# Patient Record
Sex: Female | Born: 1960 | Race: Black or African American | Hispanic: No | Marital: Single | Smoking: Former smoker
Health system: Southern US, Community
[De-identification: ages and names within clinical notes are randomized; demographics above are authoritative.]

## PROBLEM LIST (undated history)

## (undated) DIAGNOSIS — F419 Anxiety disorder, unspecified: Secondary | ICD-10-CM

## (undated) DIAGNOSIS — I10 Essential (primary) hypertension: Secondary | ICD-10-CM

## (undated) DIAGNOSIS — M1990 Unspecified osteoarthritis, unspecified site: Secondary | ICD-10-CM

## (undated) DIAGNOSIS — D219 Benign neoplasm of connective and other soft tissue, unspecified: Secondary | ICD-10-CM

## (undated) DIAGNOSIS — T7840XA Allergy, unspecified, initial encounter: Secondary | ICD-10-CM

## (undated) DIAGNOSIS — R011 Cardiac murmur, unspecified: Secondary | ICD-10-CM

## (undated) DIAGNOSIS — E119 Type 2 diabetes mellitus without complications: Secondary | ICD-10-CM

## (undated) DIAGNOSIS — D649 Anemia, unspecified: Secondary | ICD-10-CM

## (undated) HISTORY — DX: Allergy, unspecified, initial encounter: T78.40XA

## (undated) HISTORY — DX: Cardiac murmur, unspecified: R01.1

## (undated) HISTORY — DX: Anxiety disorder, unspecified: F41.9

## (undated) HISTORY — DX: Anemia, unspecified: D64.9

## (undated) HISTORY — DX: Unspecified osteoarthritis, unspecified site: M19.90

---

## 2018-08-21 ENCOUNTER — Emergency Department: Payer: No Typology Code available for payment source

## 2018-08-21 ENCOUNTER — Inpatient Hospital Stay
Admission: EM | Admit: 2018-08-21 | Discharge: 2018-08-24 | DRG: 329 | Disposition: A | Discharge status: Home / Self Care | Payer: No Typology Code available for payment source | Attending: Surgery | Admitting: Surgery

## 2018-08-21 ENCOUNTER — Encounter: Payer: Self-pay | Admitting: Emergency Medicine

## 2018-08-21 ENCOUNTER — Other Ambulatory Visit: Payer: Self-pay

## 2018-08-21 DIAGNOSIS — Z20828 Contact with and (suspected) exposure to other viral communicable diseases: Secondary | ICD-10-CM | POA: Diagnosis present

## 2018-08-21 DIAGNOSIS — Z9851 Tubal ligation status: Secondary | ICD-10-CM

## 2018-08-21 DIAGNOSIS — K439 Ventral hernia without obstruction or gangrene: Secondary | ICD-10-CM | POA: Diagnosis not present

## 2018-08-21 DIAGNOSIS — K55029 Acute infarction of small intestine, extent unspecified: Secondary | ICD-10-CM | POA: Diagnosis present

## 2018-08-21 DIAGNOSIS — Z885 Allergy status to narcotic agent status: Secondary | ICD-10-CM

## 2018-08-21 DIAGNOSIS — Z6841 Body Mass Index (BMI) 40.0 and over, adult: Secondary | ICD-10-CM

## 2018-08-21 DIAGNOSIS — K436 Other and unspecified ventral hernia with obstruction, without gangrene: Principal | ICD-10-CM | POA: Diagnosis present

## 2018-08-21 DIAGNOSIS — D259 Leiomyoma of uterus, unspecified: Secondary | ICD-10-CM | POA: Diagnosis present

## 2018-08-21 DIAGNOSIS — Z9889 Other specified postprocedural states: Secondary | ICD-10-CM

## 2018-08-21 DIAGNOSIS — Z888 Allergy status to other drugs, medicaments and biological substances status: Secondary | ICD-10-CM

## 2018-08-21 DIAGNOSIS — E119 Type 2 diabetes mellitus without complications: Secondary | ICD-10-CM | POA: Diagnosis present

## 2018-08-21 DIAGNOSIS — K450 Other specified abdominal hernia with obstruction, without gangrene: Secondary | ICD-10-CM

## 2018-08-21 DIAGNOSIS — R188 Other ascites: Secondary | ICD-10-CM | POA: Diagnosis present

## 2018-08-21 DIAGNOSIS — Z8719 Personal history of other diseases of the digestive system: Secondary | ICD-10-CM

## 2018-08-21 DIAGNOSIS — I10 Essential (primary) hypertension: Secondary | ICD-10-CM | POA: Diagnosis present

## 2018-08-21 HISTORY — DX: Type 2 diabetes mellitus without complications: E11.9

## 2018-08-21 HISTORY — DX: Benign neoplasm of connective and other soft tissue, unspecified: D21.9

## 2018-08-21 HISTORY — DX: Essential (primary) hypertension: I10

## 2018-08-21 LAB — COMPREHENSIVE METABOLIC PANEL
ALT: 12 U/L (ref 0–44)
AST: 16 U/L (ref 15–41)
Albumin: 4.2 g/dL (ref 3.5–5.0)
Alkaline Phosphatase: 71 U/L (ref 38–126)
Anion gap: 11 (ref 5–15)
BUN: 16 mg/dL (ref 6–20)
CO2: 28 mmol/L (ref 22–32)
Calcium: 9.4 mg/dL (ref 8.9–10.3)
Chloride: 98 mmol/L (ref 98–111)
Creatinine, Ser: 0.61 mg/dL (ref 0.44–1.00)
GFR calc Af Amer: >60 mL/min (ref >60)
GFR calc non Af Amer: >60 mL/min (ref >60)
Glucose, Bld: 174 mg/dL — ABNORMAL HIGH (ref 70–99)
Potassium: 3.1 mmol/L — ABNORMAL LOW (ref 3.5–5.1)
Sodium: 137 mmol/L (ref 135–145)
Total Bilirubin: 0.6 mg/dL (ref 0.3–1.2)
Total Protein: 8.2 g/dL — ABNORMAL HIGH (ref 6.5–8.1)

## 2018-08-21 LAB — LIPASE, BLOOD: Lipase: 20 U/L (ref 11–51)

## 2018-08-21 LAB — CBC
HCT: 40 % (ref 36.0–46.0)
Hemoglobin: 12.9 g/dL (ref 12.0–15.0)
MCH: 27 pg (ref 26.0–34.0)
MCHC: 32.3 g/dL (ref 30.0–36.0)
MCV: 83.9 fL (ref 80.0–100.0)
Platelets: 352 10*3/uL (ref 150–400)
RBC: 4.77 MIL/uL (ref 3.87–5.11)
RDW: 17.1 % — ABNORMAL HIGH (ref 11.5–15.5)
WBC: 14.2 10*3/uL — ABNORMAL HIGH (ref 4.0–10.5)
nRBC: 0 % (ref 0.0–0.2)

## 2018-08-21 MED ORDER — MORPHINE SULFATE (PF) 2 MG/ML IV SOLN
2.0000 mg | Freq: Once | INTRAVENOUS | Status: AC
  Administered 2018-08-21: 2 mg via INTRAVENOUS
  Filled 2018-08-21: qty 1

## 2018-08-21 MED ORDER — ONDANSETRON HCL 4 MG/2ML IJ SOLN
4.0000 mg | Freq: Once | INTRAMUSCULAR | Status: AC
  Administered 2018-08-21: 4 mg via INTRAVENOUS
  Filled 2018-08-21: qty 2

## 2018-08-21 MED ORDER — IOHEXOL 300 MG/ML  SOLN
100.0000 mL | Freq: Once | INTRAMUSCULAR | Status: AC | PRN
  Administered 2018-08-22: 100 mL via INTRAVENOUS

## 2018-08-21 MED ORDER — SODIUM CHLORIDE 0.9% FLUSH: 3.0000 mL | Freq: Once | INTRAVENOUS | Status: DC

## 2018-08-21 NOTE — ED Triage Notes (Signed)
Pt c/o lower abdominal pain as well as distention x1 day. Pt had "partial" BM this AM but has not had normal BM on Saturday. Pt has hx/o hernia and fibroids.

## 2018-08-21 NOTE — ED Provider Notes (Signed)
[facility] Emergency Department Provider Note  ____________________________________________   First MD Initiated Contact with Patient 08/21/18 2307     (approximate)  I have reviewed the triage vital signs and the nursing notes.   HISTORY  Chief Complaint Abdominal Pain and Bloated    HPI Lynn Hughes is a 58 y.o. female with below list of previous medical conditions including diabetes hypertension fibroids and abdominal hernia presents to the emergency department with a 1 day history of nonfocal abdominal pain with associated nausea and vomiting.  Patient states that she had a bowel movement this morning however it was not a typical bowel movement for her far less than usual.  Patient states that current pain score is 4 out of 10.  Patient denies any aggravating or alleviating factors for her pain.  Patient denies any urinary symptoms.  Patient denies any fever      Past Medical History:  Diagnosis Date   Diabetes mellitus without complication ([place])    Fibroids    Hypertension     Patient Active Problem List   Diagnosis Date Noted   S/P repair of ventral hernia 08/22/2018    History reviewed. No pertinent surgical history.  Prior to Admission medications   Not on File    Allergies Ace inhibitors and Codeine  History reviewed. No pertinent family history.  Social History Social History   Tobacco Use   Smoking status: Never Smoker   Smokeless tobacco: Never Used  Substance Use Topics   Alcohol use: Not on file   Drug use: Not on file    Review of Systems Constitutional: No fever/chills Eyes: No visual changes. ENT: No sore throat. Cardiovascular: Denies chest pain. Respiratory: Denies shortness of breath. Gastrointestinal: Positive for abdominal pain, nausea & vomiting Genitourinary: Negative for dysuria. Musculoskeletal: Negative for neck pain.  Negative for back pain. Integumentary: Negative for  rash. Neurological: Negative for headaches, focal weakness or numbness.   ____________________________________________   PHYSICAL EXAM:  VITAL SIGNS: ED Triage Vitals  Enc Vitals Group     BP 08/21/18 1916 (!) 167/69     Pulse Rate 08/21/18 1916 (!) 110     Resp 08/21/18 1916 20     Temp 08/21/18 1916 99.1 F (37.3 C)     Temp Source 08/21/18 1916 Oral     SpO2 08/21/18 1916 96 %     Weight --      Height --      Head Circumference --      Peak Flow --      Pain Score 08/21/18 2224 5     Pain Loc --      Pain Edu? --      Excl. in [place]? --     Constitutional: Alert and oriented. Well appearing and in no acute distress. Eyes: Conjunctivae are normal.  Mouth/Throat: Mucous membranes are moist.  Oropharynx non-erythematous. Neck: No stridor.  Cardiovascular: Normal rate, regular rhythm. Good peripheral circulation. Grossly normal heart sounds. Respiratory: Normal respiratory effort.  No retractions. No audible wheezing. Gastrointestinal: Large, hard bulging area left of the umbilicus noted, tender to palpation..  Musculoskeletal: No lower extremity tenderness nor edema. No gross deformities of extremities. Neurologic:  Normal speech and language. No gross focal neurologic deficits are appreciated.  Skin:  Skin is warm, dry and intact. No rash noted. Psychiatric: Mood and affect are normal. Speech and behavior are normal.  ____________________________________________   LABS (all labs ordered are listed, but only abnormal results are displayed)  Labs Reviewed  COMPREHENSIVE METABOLIC PANEL - Abnormal; Notable for the following components:      Result Value   Potassium 3.1 (*)    Glucose, Bld 174 (*)    Total Protein 8.2 (*)    All other components within normal limits  CBC - Abnormal; Notable for the following components:   WBC 14.2 (*)    RDW 17.1 (*)    All other components within normal limits  GLUCOSE, CAPILLARY - Abnormal; Notable for the following components:    Glucose-Capillary 142 (*)    All other components within normal limits  SARS CORONAVIRUS 2 (HOSPITAL ORDER, [place] [facility])  LIPASE, BLOOD  HIV ANTIBODY (ROUTINE TESTING W REFLEX)  CBC  BASIC METABOLIC PANEL  MAGNESIUM  PHOSPHORUS  SURGICAL PATHOLOGY   _____________________  RADIOLOGY I, Tibes N Alexias Margerum, personally viewed and evaluated these images (plain radiographs) as part of my medical decision making, as well as reviewing the written report by the radiologist.  ED MD interpretation: Incarcerated large 16 cm left periumbilical abdominal hernia containing dilated and inflamed small bowel loops plus small volume of free fluid on CT scan of the abdomen pelvis interpretation per radiologist.  Official radiology report(s): Ct Abdomen Pelvis W Contrast  Result Date: 08/22/2018 CLINICAL DATA:  58 year old female with nausea vomiting and abdominal pain. EXAM: CT ABDOMEN AND PELVIS WITH CONTRAST TECHNIQUE: Multidetector CT imaging of the abdomen and pelvis was performed using the standard protocol following bolus administration of intravenous contrast. CONTRAST:  145mL OMNIPAQUE IOHEXOL 300 MG/ML  SOLN COMPARISON:  None. FINDINGS: Lower chest: Mild lung base atelectasis. No pericardial or pleural effusion. Hepatobiliary: Negative gallbladder. Occasional small low-density areas in the liver (subcapsular right hepatic lobe series 2, image 15) which are too small to characterize but probably benign. Pancreas: Negative. Spleen: Negative. Adrenals/Urinary Tract: Normal adrenal glands. Bilateral renal enhancement and contrast excretion is symmetric and normal. Occasional small low-density areas in the kidneys which are too small to characterize but probably benign (series 2, image 35 on the right). Proximal ureters are decompressed. Diminutive and unremarkable urinary bladder. Stomach/Bowel: Negative rectosigmoid colon. The proximal sigmoid is redundant. The descending colon is  decompressed with diverticulosis, no active inflammation. Decompressed and negative transverse colon. Decompressed right colon with diverticulosis. Normal appendix situated along the right lateral aspect of the enlarged uterus on series 2, image 49. Decompressed terminal ileum. There is a left periumbilical herniation of small bowel and mesentery with a small volume of associated free fluid and dilated, inflamed appearing small bowel loops which measure up to 35-40 millimeters diameter. The hernia sac is approximately 16.1 centimeters with a neck of about 3.2 centimeters. See coronal image 18. There is inflammatory stranding in and around the hernia sac. The stomach and proximal jejunum are decompressed, although immediately upstream of the hernia there are mildly dilated gas and fluid-filled small bowel loops. No free air. No other free fluid except for that in the hernia sac. Vascular/Lymphatic: Major arterial structures in the abdomen and pelvis appear patent and normal. Portal venous system appears grossly patent. No lymphadenopathy. Reproductive: Markedly enlarged uterus, with a multi-septated heterogeneously hypo dense expansile appearance of the fundus possibly related to abnormality of the endometrium (sagittal images 74-76). There are at least several small superimposed uterine fibroids with dystrophic calcification (series 2, image 62). Overall the uterus encompasses 16.7 x 18.5 by 20.7 centimeters (AP by transverse by CC) The ovaries seem to remain normal. Other: No pelvic free fluid. Musculoskeletal: No acute or  suspicious osseous lesion. Degenerative changes at the pubic symphysis. IMPRESSION: 1. Incarcerated large 16 cm left periumbilical abdominal hernia containing dilated and inflamed small bowel loops plus a small volume of free fluid. No free air to suggest perforation. 2. Positive also for a markedly enlarged and heterogeneous uterus (16.7 x 18.5 x 20.7 cm). Although the abnormality might be  related to bulky fibroid disease, cannot exclude abnormal endometrium. No ascites. Ovaries appear normal. Recommend GYN Surgery consultation. 3. Diverticulosis of the large bowel. Several small low-density areas in the liver and kidneys which are too small to characterize but probably benign cysts. Electronically Signed   By: Genevie Ann M.D.   On: 08/22/2018 00:29    _______________________________________   .Critical Care Performed by: Gregor Hams, MD Authorized by: Gregor Hams, MD   Critical care provider statement:    Critical care time (minutes):  30   Critical care time was exclusive of:  Separately billable procedures and treating other patients (Incarcerated abdominal hernia)   Critical care was time spent personally by me on the following activities:  Development of treatment plan with patient or surrogate, discussions with consultants, evaluation of patient's response to treatment, examination of patient, obtaining history from patient or surrogate, ordering and performing treatments and interventions, ordering and review of laboratory studies, ordering and review of radiographic studies, pulse oximetry, re-evaluation of patient's condition and review of old charts     ____________________________________________   INITIAL IMPRESSION / MDM / [place] / ED COURSE  As part of my medical decision making, I reviewed the following data within the electronic MEDICAL RECORD NUMBER 58 year old female presenting with above-stated history and physical exam secondary to abdominal pain vomiting.  Patient was given IV morphine as well as Zofran.  Concern for possible bowel obstruction, incarcerated hernia and a such CT scan of the abdomen pelvis performed which revealed evidence of an incarcerated hernia as well as a large fibroid uterus.  Patient discussed with Dr. Dahlia Byes at 12:39 AM who states that he will evaluate the patient in the emergency department however he is awaiting  COVID testing to return.  Patient taken to the operating room by Dr. Dahlia Byes     ____________________________________________  FINAL CLINICAL IMPRESSION(S) / ED DIAGNOSES  Final diagnoses:  Incarcerated hernia of abdominal cavity     MEDICATIONS GIVEN DURING THIS VISIT:  Medications  enoxaparin (LOVENOX) injection 40 mg (has no administration in time range)  0.9 %  sodium chloride infusion (has no administration in time range)  piperacillin-tazobactam (ZOSYN) IVPB 3.375 g (has no administration in time range)  acetaminophen (TYLENOL) tablet 1,000 mg (has no administration in time range)  ketorolac (TORADOL) 30 MG/ML injection 30 mg (has no administration in time range)  oxyCODONE (Oxy IR/ROXICODONE) immediate release tablet 5-10 mg (has no administration in time range)  morphine 2 MG/ML injection 2 mg (has no administration in time range)  ondansetron (ZOFRAN-ODT) disintegrating tablet 4 mg (has no administration in time range)    Or  ondansetron (ZOFRAN) injection 4 mg (has no administration in time range)  pantoprazole (PROTONIX) injection 40 mg (has no administration in time range)  hydrALAZINE (APRESOLINE) injection 10 mg (has no administration in time range)  insulin aspart (novoLOG) injection 0-20 Units (has no administration in time range)  insulin aspart (novoLOG) injection 0-5 Units (has no administration in time range)  insulin aspart (novoLOG) injection 6 Units (has no administration in time range)  morphine 2 MG/ML injection 2 mg (2 mg Intravenous  Given 08/21/18 2351)  ondansetron (ZOFRAN) injection 4 mg (4 mg Intravenous Given 08/21/18 2351)  iohexol (OMNIPAQUE) 300 MG/ML solution 100 mL (100 mLs Intravenous Contrast Given 08/22/18 0003)  sodium chloride 0.9 % bolus 1,000 mL (1,000 mLs Intravenous New Bag/Given 08/22/18 0128)  fentaNYL (SUBLIMAZE) 100 MCG/2ML injection (has no administration in time range)  midazolam (VERSED) 2 MG/2ML injection (has no administration in  time range)  propofol (DIPRIVAN) 10 mg/mL bolus/IV push (has no administration in time range)  lidocaine (XYLOCAINE) 2 % injection (has no administration in time range)  fentaNYL (SUBLIMAZE) 100 MCG/2ML injection (has no administration in time range)  dexamethasone (DECADRON) 10 MG/ML injection (has no administration in time range)  ondansetron (ZOFRAN) 4 MG/2ML injection (has no administration in time range)  oxymetazoline (AFRIN) 0.05 % nasal spray (has no administration in time range)  acetaminophen (OFIRMEV) 10 MG/ML IV (has no administration in time range)  sugammadex sodium (BRIDION) 200 MG/2ML injection (has no administration in time range)     ED Discharge Orders    None      *Please note:  Lynn Hughes was evaluated in Emergency Department on 08/22/2018 for the symptoms described in the history of present illness. She was evaluated in the context of the global COVID-19 pandemic, which necessitated consideration that the patient might be at risk for infection with the SARS-CoV-2 virus that causes COVID-19. Institutional protocols and algorithms that pertain to the evaluation of patients at risk for COVID-19 are in a state of rapid change based on information released by regulatory bodies including the CDC and federal and state organizations. These policies and algorithms were followed during the patient's care in the ED.  Some ED evaluations and interventions may be delayed as a result of limited staffing during the pandemic.*  Note:  This document was prepared using Dragon voice recognition software and may include unintentional dictation errors.   Gregor Hams, MD 08/22/18 [phone]

## 2018-08-22 ENCOUNTER — Emergency Department: Payer: No Typology Code available for payment source | Admitting: Anesthesiology

## 2018-08-22 ENCOUNTER — Encounter: Payer: Self-pay | Admitting: Anesthesiology

## 2018-08-22 ENCOUNTER — Encounter
Admission: EM | Disposition: A | Discharge status: Home / Self Care | Payer: Self-pay | Source: Home / Self Care | Attending: Surgery

## 2018-08-22 DIAGNOSIS — Z6841 Body Mass Index (BMI) 40.0 and over, adult: Secondary | ICD-10-CM | POA: Diagnosis not present

## 2018-08-22 DIAGNOSIS — I10 Essential (primary) hypertension: Secondary | ICD-10-CM | POA: Diagnosis present

## 2018-08-22 DIAGNOSIS — Z9851 Tubal ligation status: Secondary | ICD-10-CM | POA: Diagnosis not present

## 2018-08-22 DIAGNOSIS — E119 Type 2 diabetes mellitus without complications: Secondary | ICD-10-CM | POA: Diagnosis present

## 2018-08-22 DIAGNOSIS — Z888 Allergy status to other drugs, medicaments and biological substances status: Secondary | ICD-10-CM | POA: Diagnosis not present

## 2018-08-22 DIAGNOSIS — K430 Incisional hernia with obstruction, without gangrene: Secondary | ICD-10-CM | POA: Diagnosis not present

## 2018-08-22 DIAGNOSIS — K55029 Acute infarction of small intestine, extent unspecified: Secondary | ICD-10-CM | POA: Diagnosis present

## 2018-08-22 DIAGNOSIS — Z9889 Other specified postprocedural states: Secondary | ICD-10-CM

## 2018-08-22 DIAGNOSIS — Z885 Allergy status to narcotic agent status: Secondary | ICD-10-CM | POA: Diagnosis not present

## 2018-08-22 DIAGNOSIS — Z8719 Personal history of other diseases of the digestive system: Secondary | ICD-10-CM

## 2018-08-22 DIAGNOSIS — D259 Leiomyoma of uterus, unspecified: Secondary | ICD-10-CM | POA: Diagnosis present

## 2018-08-22 DIAGNOSIS — Z20828 Contact with and (suspected) exposure to other viral communicable diseases: Secondary | ICD-10-CM | POA: Diagnosis present

## 2018-08-22 DIAGNOSIS — K439 Ventral hernia without obstruction or gangrene: Secondary | ICD-10-CM | POA: Diagnosis present

## 2018-08-22 DIAGNOSIS — K436 Other and unspecified ventral hernia with obstruction, without gangrene: Secondary | ICD-10-CM | POA: Diagnosis present

## 2018-08-22 DIAGNOSIS — R188 Other ascites: Secondary | ICD-10-CM | POA: Diagnosis present

## 2018-08-22 HISTORY — PX: VENTRAL HERNIA REPAIR: SHX424

## 2018-08-22 HISTORY — PX: BOWEL RESECTION: SHX1257

## 2018-08-22 LAB — MAGNESIUM: Magnesium: 1.9 mg/dL (ref 1.7–2.4)

## 2018-08-22 LAB — GLUCOSE, CAPILLARY
Glucose-Capillary: 142 mg/dL — ABNORMAL HIGH (ref 70–99)
Glucose-Capillary: 155 mg/dL — ABNORMAL HIGH (ref 70–99)
Glucose-Capillary: 124 mg/dL — ABNORMAL HIGH (ref 70–99)
Glucose-Capillary: 103 mg/dL — ABNORMAL HIGH (ref 70–99)
Glucose-Capillary: 117 mg/dL — ABNORMAL HIGH (ref 70–99)

## 2018-08-22 LAB — CBC
HCT: 39.8 % (ref 36.0–46.0)
Hemoglobin: 12.4 g/dL (ref 12.0–15.0)
MCH: 26.7 pg (ref 26.0–34.0)
MCHC: 31.2 g/dL (ref 30.0–36.0)
MCV: 85.6 fL (ref 80.0–100.0)
Platelets: 354 10*3/uL (ref 150–400)
RBC: 4.65 MIL/uL (ref 3.87–5.11)
RDW: 17.3 % — ABNORMAL HIGH (ref 11.5–15.5)
WBC: 15.4 10*3/uL — ABNORMAL HIGH (ref 4.0–10.5)
nRBC: 0 % (ref 0.0–0.2)

## 2018-08-22 LAB — BASIC METABOLIC PANEL
Anion gap: 10 (ref 5–15)
BUN: 15 mg/dL (ref 6–20)
CO2: 29 mmol/L (ref 22–32)
Calcium: 8.6 mg/dL — ABNORMAL LOW (ref 8.9–10.3)
Chloride: 100 mmol/L (ref 98–111)
Creatinine, Ser: 0.88 mg/dL (ref 0.44–1.00)
GFR calc Af Amer: >60 mL/min (ref >60)
GFR calc non Af Amer: >60 mL/min (ref >60)
Glucose, Bld: 171 mg/dL — ABNORMAL HIGH (ref 70–99)
Potassium: 3.3 mmol/L — ABNORMAL LOW (ref 3.5–5.1)
Sodium: 139 mmol/L (ref 135–145)

## 2018-08-22 LAB — PHOSPHORUS: Phosphorus: 4.8 mg/dL — ABNORMAL HIGH (ref 2.5–4.6)

## 2018-08-22 LAB — SARS CORONAVIRUS 2 BY RT PCR (HOSPITAL ORDER, PERFORMED IN ~~LOC~~ HOSPITAL LAB): SARS Coronavirus 2: NEGATIVE

## 2018-08-22 SURGERY — REPAIR, HERNIA, VENTRAL
Anesthesia: General | Site: Abdomen

## 2018-08-22 MED ORDER — LIDOCAINE HCL (PF) 2 % IJ SOLN
INTRAMUSCULAR | Status: AC
  Filled 2018-08-22: qty 10

## 2018-08-22 MED ORDER — DEXAMETHASONE SODIUM PHOSPHATE 10 MG/ML IJ SOLN
INTRAMUSCULAR | Status: DC | PRN
  Administered 2018-08-22: 5 mg via INTRAVENOUS

## 2018-08-22 MED ORDER — PHENYLEPHRINE HCL (PRESSORS) 10 MG/ML IV SOLN
INTRAVENOUS | Status: DC | PRN
  Administered 2018-08-22 (×5): 100 ug via INTRAVENOUS

## 2018-08-22 MED ORDER — PIPERACILLIN-TAZOBACTAM 3.375 G IVPB 30 MIN: 3.3750 g | Freq: Three times a day (TID) | INTRAVENOUS | Status: DC

## 2018-08-22 MED ORDER — MORPHINE SULFATE (PF) 2 MG/ML IV SOLN: 2.0000 mg | INTRAVENOUS | Status: DC | PRN

## 2018-08-22 MED ORDER — FENTANYL CITRATE (PF) 100 MCG/2ML IJ SOLN: 25.0000 ug | INTRAMUSCULAR | Status: DC | PRN

## 2018-08-22 MED ORDER — HYDRALAZINE HCL 20 MG/ML IJ SOLN: 10.0000 mg | INTRAMUSCULAR | Status: DC | PRN

## 2018-08-22 MED ORDER — PROPOFOL 10 MG/ML IV BOLUS
INTRAVENOUS | Status: DC | PRN
  Administered 2018-08-22: 120 mg via INTRAVENOUS

## 2018-08-22 MED ORDER — PANTOPRAZOLE SODIUM 40 MG IV SOLR
40.0000 mg | Freq: Two times a day (BID) | INTRAVENOUS | Status: DC
  Administered 2018-08-22 – 2018-08-24 (×5): 40 mg via INTRAVENOUS
  Filled 2018-08-22 (×5): qty 40

## 2018-08-22 MED ORDER — PIPERACILLIN-TAZOBACTAM 3.375 G IVPB
3.3750 g | Freq: Three times a day (TID) | INTRAVENOUS | Status: DC
  Administered 2018-08-22 – 2018-08-24 (×7): 3.375 g via INTRAVENOUS
  Filled 2018-08-22 (×7): qty 50

## 2018-08-22 MED ORDER — PROPOFOL 10 MG/ML IV BOLUS
INTRAVENOUS | Status: AC
  Filled 2018-08-22: qty 20

## 2018-08-22 MED ORDER — ONDANSETRON HCL 4 MG/2ML IJ SOLN
INTRAMUSCULAR | Status: DC | PRN
  Administered 2018-08-22: 4 mg via INTRAVENOUS

## 2018-08-22 MED ORDER — SUCCINYLCHOLINE CHLORIDE 20 MG/ML IJ SOLN
INTRAMUSCULAR | Status: DC | PRN
  Administered 2018-08-22: 120 mg via INTRAVENOUS

## 2018-08-22 MED ORDER — LIDOCAINE HCL (CARDIAC) PF 100 MG/5ML IV SOSY
PREFILLED_SYRINGE | INTRAVENOUS | Status: DC | PRN
  Administered 2018-08-22: 60 mg via INTRAVENOUS

## 2018-08-22 MED ORDER — BUPIVACAINE-EPINEPHRINE (PF) 0.25% -1:200000 IJ SOLN
INTRAMUSCULAR | Status: DC | PRN
  Administered 2018-08-22: 30 mL

## 2018-08-22 MED ORDER — ONDANSETRON HCL 4 MG/2ML IJ SOLN: 4.0000 mg | Freq: Four times a day (QID) | INTRAMUSCULAR | Status: DC | PRN

## 2018-08-22 MED ORDER — PIPERACILLIN-TAZOBACTAM 3.375 G IVPB
3.3750 g | Freq: Three times a day (TID) | INTRAVENOUS | Status: DC
  Administered 2018-08-22: 3.375 g via INTRAVENOUS
  Filled 2018-08-22: qty 50

## 2018-08-22 MED ORDER — SUGAMMADEX SODIUM 200 MG/2ML IV SOLN
INTRAVENOUS | Status: DC | PRN
  Administered 2018-08-22: 200 mg via INTRAVENOUS

## 2018-08-22 MED ORDER — INSULIN ASPART 100 UNIT/ML ~~LOC~~ SOLN
0.0000 [IU] | Freq: Three times a day (TID) | SUBCUTANEOUS | Status: DC
  Administered 2018-08-22: 4 [IU] via SUBCUTANEOUS
  Administered 2018-08-22 – 2018-08-23 (×3): 3 [IU] via SUBCUTANEOUS
  Administered 2018-08-23: 4 [IU] via SUBCUTANEOUS
  Administered 2018-08-24 (×2): 3 [IU] via SUBCUTANEOUS
  Filled 2018-08-22 (×7): qty 1

## 2018-08-22 MED ORDER — ONDANSETRON HCL 4 MG/2ML IJ SOLN
INTRAMUSCULAR | Status: AC
  Filled 2018-08-22: qty 2

## 2018-08-22 MED ORDER — BUPIVACAINE LIPOSOME 1.3 % IJ SUSP
INTRAMUSCULAR | Status: DC | PRN
  Administered 2018-08-22: 20 mL

## 2018-08-22 MED ORDER — FENTANYL CITRATE (PF) 100 MCG/2ML IJ SOLN
INTRAMUSCULAR | Status: AC
  Filled 2018-08-22: qty 2

## 2018-08-22 MED ORDER — INSULIN ASPART 100 UNIT/ML ~~LOC~~ SOLN
6.0000 [IU] | Freq: Three times a day (TID) | SUBCUTANEOUS | Status: DC
  Administered 2018-08-22 – 2018-08-24 (×5): 6 [IU] via SUBCUTANEOUS
  Filled 2018-08-22 (×6): qty 1

## 2018-08-22 MED ORDER — ROCURONIUM BROMIDE 100 MG/10ML IV SOLN
INTRAVENOUS | Status: DC | PRN
  Administered 2018-08-22: 55 mg via INTRAVENOUS
  Administered 2018-08-22: 10 mg via INTRAVENOUS
  Administered 2018-08-22: 5 mg via INTRAVENOUS

## 2018-08-22 MED ORDER — POTASSIUM CHLORIDE 10 MEQ/100ML IV SOLN
10.0000 meq | INTRAVENOUS | Status: DC
  Filled 2018-08-22 (×3): qty 100

## 2018-08-22 MED ORDER — ENOXAPARIN SODIUM 40 MG/0.4ML ~~LOC~~ SOLN
40.0000 mg | Freq: Two times a day (BID) | SUBCUTANEOUS | Status: DC
  Administered 2018-08-23 – 2018-08-24 (×3): 40 mg via SUBCUTANEOUS
  Filled 2018-08-22 (×3): qty 0.4

## 2018-08-22 MED ORDER — ACETAMINOPHEN 10 MG/ML IV SOLN
INTRAVENOUS | Status: AC
  Filled 2018-08-22: qty 100

## 2018-08-22 MED ORDER — MIDAZOLAM HCL 2 MG/2ML IJ SOLN
INTRAMUSCULAR | Status: AC
  Filled 2018-08-22: qty 2

## 2018-08-22 MED ORDER — ONDANSETRON HCL 4 MG/2ML IJ SOLN: 4.0000 mg | Freq: Once | INTRAMUSCULAR | Status: DC | PRN

## 2018-08-22 MED ORDER — FENTANYL CITRATE (PF) 100 MCG/2ML IJ SOLN
INTRAMUSCULAR | Status: DC | PRN
  Administered 2018-08-22: 100 ug via INTRAVENOUS

## 2018-08-22 MED ORDER — SODIUM CHLORIDE 0.9 % IV SOLN
INTRAVENOUS | Status: DC
  Administered 2018-08-22 – 2018-08-23 (×3): via INTRAVENOUS

## 2018-08-22 MED ORDER — KETOROLAC TROMETHAMINE 30 MG/ML IJ SOLN
30.0000 mg | Freq: Four times a day (QID) | INTRAMUSCULAR | Status: DC
  Administered 2018-08-22 – 2018-08-23 (×6): 30 mg via INTRAVENOUS
  Filled 2018-08-22 (×6): qty 1

## 2018-08-22 MED ORDER — SUGAMMADEX SODIUM 200 MG/2ML IV SOLN
INTRAVENOUS | Status: AC
  Filled 2018-08-22: qty 2

## 2018-08-22 MED ORDER — MIDAZOLAM HCL 2 MG/2ML IJ SOLN
INTRAMUSCULAR | Status: DC | PRN
  Administered 2018-08-22: 2 mg via INTRAVENOUS

## 2018-08-22 MED ORDER — ACETAMINOPHEN 500 MG PO TABS
1000.0000 mg | ORAL_TABLET | Freq: Four times a day (QID) | ORAL | Status: DC
  Administered 2018-08-22 – 2018-08-24 (×9): 1000 mg via ORAL
  Filled 2018-08-22 (×10): qty 2

## 2018-08-22 MED ORDER — SODIUM CHLORIDE 0.9 % IV BOLUS
1000.0000 mL | Freq: Once | INTRAVENOUS | Status: AC
  Administered 2018-08-22: 1000 mL via INTRAVENOUS

## 2018-08-22 MED ORDER — INSULIN ASPART 100 UNIT/ML ~~LOC~~ SOLN
0.0000 [IU] | Freq: Every day | SUBCUTANEOUS | Status: DC
  Administered 2018-08-23: 2 [IU] via SUBCUTANEOUS
  Filled 2018-08-22: qty 1

## 2018-08-22 MED ORDER — POTASSIUM CHLORIDE CRYS ER 20 MEQ PO TBCR
40.0000 meq | EXTENDED_RELEASE_TABLET | Freq: Two times a day (BID) | ORAL | Status: DC
  Administered 2018-08-22 – 2018-08-24 (×4): 40 meq via ORAL
  Filled 2018-08-22 (×4): qty 2

## 2018-08-22 MED ORDER — ACETAMINOPHEN 10 MG/ML IV SOLN
INTRAVENOUS | Status: DC | PRN
  Administered 2018-08-22: 1000 mg via INTRAVENOUS

## 2018-08-22 MED ORDER — OXYMETAZOLINE HCL 0.05 % NA SOLN
NASAL | Status: AC
  Filled 2018-08-22: qty 30

## 2018-08-22 MED ORDER — DEXAMETHASONE SODIUM PHOSPHATE 10 MG/ML IJ SOLN
INTRAMUSCULAR | Status: AC
  Filled 2018-08-22: qty 1

## 2018-08-22 MED ORDER — SODIUM CHLORIDE 0.9 % IV SOLN
INTRAVENOUS | Status: DC | PRN
  Administered 2018-08-22: 03:00:00 via INTRAVENOUS

## 2018-08-22 MED ORDER — ONDANSETRON 4 MG PO TBDP
4.0000 mg | ORAL_TABLET | Freq: Four times a day (QID) | ORAL | Status: DC | PRN
  Filled 2018-08-22: qty 1

## 2018-08-22 MED ORDER — OXYCODONE HCL 5 MG PO TABS: 5.0000 mg | ORAL_TABLET | ORAL | Status: DC | PRN

## 2018-08-22 SURGICAL SUPPLY — 48 items
APPLIER CLIP 11 MED OPEN (CLIP)
APPLIER CLIP 13 LRG OPEN (CLIP)
BLADE CLIPPER SURG (BLADE) IMPLANT
BULB RESERV EVAC DRAIN JP 100C (MISCELLANEOUS) ×3 IMPLANT
CANISTER SUCT 3000ML PPV (MISCELLANEOUS) IMPLANT
CATH FOLEY 2WAY SIL 16X30 (CATHETERS) ×3 IMPLANT
CHLORAPREP W/TINT 26 (MISCELLANEOUS) ×3 IMPLANT
CLIP APPLIE 11 MED OPEN (CLIP) IMPLANT
CLIP APPLIE 13 LRG OPEN (CLIP) IMPLANT
COVER WAND RF STERILE (DRAPES) IMPLANT
DRAIN CHANNEL JP 15F RND 16 (MISCELLANEOUS) ×3 IMPLANT
DRAPE LAPAROTOMY 100X77 ABD (DRAPES) ×3 IMPLANT
DRSG OPSITE POSTOP 4X8 (GAUZE/BANDAGES/DRESSINGS) ×3 IMPLANT
DRSG TEGADERM 4X4.75 (GAUZE/BANDAGES/DRESSINGS) ×3 IMPLANT
DRSG TELFA 3X8 NADH (GAUZE/BANDAGES/DRESSINGS) IMPLANT
ELECT CAUTERY BLADE 6.4 (BLADE) ×3 IMPLANT
ELECT REM PT RETURN 9FT ADLT (ELECTROSURGICAL) ×3
ELECTRODE REM PT RTRN 9FT ADLT (ELECTROSURGICAL) ×1 IMPLANT
GAUZE SPONGE 4X4 12PLY STRL (GAUZE/BANDAGES/DRESSINGS) IMPLANT
GLOVE BIO SURGEON STRL SZ7 (GLOVE) ×21 IMPLANT
GLOVE BIOGEL PI IND STRL 7.5 (GLOVE) ×3 IMPLANT
GLOVE BIOGEL PI INDICATOR 7.5 (GLOVE) ×6
GOWN STRL REUS W/ TWL LRG LVL3 (GOWN DISPOSABLE) ×3 IMPLANT
GOWN STRL REUS W/TWL LRG LVL3 (GOWN DISPOSABLE) ×6
HOVERMATT SINGLE USE (MISCELLANEOUS) ×3 IMPLANT
LIGASURE IMPACT 36 18CM CVD LR (INSTRUMENTS) ×3 IMPLANT
NEEDLE HYPO 22GX1.5 SAFETY (NEEDLE) ×3 IMPLANT
NEEDLE HYPO 25X1 1.5 SAFETY (NEEDLE) ×3 IMPLANT
PACK BASIN MINOR ARMC (MISCELLANEOUS) ×3 IMPLANT
RELOAD PROXIMATE 75MM BLUE (ENDOMECHANICALS) ×12 IMPLANT
SPONGE DRAIN TRACH 4X4 STRL 2S (GAUZE/BANDAGES/DRESSINGS) ×3 IMPLANT
SPONGE LAP 18X18 RF (DISPOSABLE) IMPLANT
STAPLER PROXIMATE 75MM BLUE (STAPLE) ×3 IMPLANT
STAPLER SKIN PROX 35W (STAPLE) ×3 IMPLANT
SUT ETHIBOND 0 MO6 C/R (SUTURE) ×6 IMPLANT
SUT ETHILON 3-0 FS-10 30 BLK (SUTURE) ×3
SUT SILK 2 0 (SUTURE) ×2
SUT SILK 2 0SH CR/8 30 (SUTURE) ×3 IMPLANT
SUT SILK 2-0 30XBRD TIE 12 (SUTURE) ×1 IMPLANT
SUT VIC AB 2-0 SH 27 (SUTURE) ×2
SUT VIC AB 2-0 SH 27XBRD (SUTURE) ×1 IMPLANT
SUT VIC AB 3-0 SH 27 (SUTURE) ×2
SUT VIC AB 3-0 SH 27X BRD (SUTURE) ×1 IMPLANT
SUTURE EHLN 3-0 FS-10 30 BLK (SUTURE) ×1 IMPLANT
SYR 20CC LL (SYRINGE) ×3 IMPLANT
TAPE MICROFOAM 4IN (TAPE) IMPLANT
TRAY FOLEY SLVR 16FR LF STAT (SET/KITS/TRAYS/PACK) ×3 IMPLANT
WATER STERILE IRR 1000ML POUR (IV SOLUTION) IMPLANT

## 2018-08-22 NOTE — ED Notes (Signed)
Consent for surgery signed by patient.  Report called to or nurse.  Pt alert.  Iv in place.  Pt on 2 liters oxygen .

## 2018-08-22 NOTE — Transfer of Care (Signed)
Immediate Anesthesia Transfer of Care Note  Patient: Lynn Hughes  Procedure(s) Performed: HERNIA REPAIR VENTRAL ADULT (N/A Abdomen) SMALL BOWEL RESECTION (N/A Abdomen)  Patient Location: PACU  Anesthesia Type:General  Level of Consciousness: drowsy and patient cooperative  Airway & Oxygen Therapy: Patient Spontanous Breathing and Patient connected to face mask oxygen  Post-op Assessment: Report given to RN and Post -op Vital signs reviewed and stable  Post vital signs: Reviewed and stable  Last Vitals:  Vitals Value Taken Time  BP 153/70 08/22/18 0436  Temp 37.3 C 08/22/18 0433  Pulse 98 08/22/18 0436  Resp 20 08/22/18 0436  SpO2 99 % 08/22/18 0436  Vitals shown include unvalidated device data.  Last Pain:  Vitals:   08/21/18 2224  TempSrc: Oral  PainSc: 5       Patients Stated Pain Goal: 0 (43/32/95 1884)  Complications: No apparent anesthesia complications

## 2018-08-22 NOTE — Anesthesia Procedure Notes (Signed)
Procedure Name: Intubation Date/Time: 08/22/2018 3:01 AM Performed by: Jonna Clark, CRNA Pre-anesthesia Checklist: Patient identified, Patient being monitored, Timeout performed, Emergency Drugs available and Suction available Patient Re-evaluated:Patient Re-evaluated prior to induction Oxygen Delivery Method: Circle system utilized Preoxygenation: Pre-oxygenation with 100% oxygen Induction Type: IV induction and Cricoid Pressure applied Ventilation: Mask ventilation without difficulty Laryngoscope Size: 3 and McGraph Grade View: Grade I Tube type: Oral Tube size: 7.0 mm Number of attempts: 1 Airway Equipment and Method: Stylet Placement Confirmation: ETT inserted through vocal cords under direct vision,  positive ETCO2 and breath sounds checked- equal and bilateral Secured at: 20 cm Tube secured with: Tape Dental Injury: Teeth and Oropharynx as per pre-operative assessment

## 2018-08-22 NOTE — Op Note (Signed)
Ventral Hernia Repair Strangulated with Small bowel resection  Pre-operative Diagnosis: Incarcerated ventral hernia  Post-operative Diagnosis: Strangulated ventral hernia with bowel necrosis  Surgeon: Caroleen Hamman, MD FACS  Anesthesia: Gen. with endotracheal tube  Findings: Strangulated ventral hernia with bowel necrosis Dilated bowel proximal to bowel necrosis   Estimated Blood Loss: 50cc         Drains: 15 Blake sub q         Specimens: Small bowel, hernia sac       Complications: none              Procedure Details  The patient was seen again in the Holding Room. The benefits, complications, treatment options, and expected outcomes were discussed with the patient. The risks of bleeding, infection, recurrence of symptoms, failure to resolve symptoms, bowel injury, mesh placement, mesh infection, any of which could require further surgery were reviewed with the patient. The likelihood of improving the patient's symptoms with return to their baseline status is good.  The patient and/or family concurred with the proposed plan, giving informed consent.  The patient was taken to Operating Room, identified as Lynn Hughes and the procedure verified.  A Time Out was held and the above information confirmed.  Prior to the induction of general anesthesia, antibiotic prophylaxis was administered. VTE prophylaxis was in place. General endotracheal anesthesia was then administered and tolerated well. After the induction, the abdomen was prepped with Chloraprep and draped in the sterile fashion. The patient was positioned in the supine position.   Incision was created with a scalpel over the hernia defect. Electrocautery was used to dissect through subcutaneous tissue, the hernia sac was opened and lyysis of adhesion was performed with Metzenbaum's scissors. Hernia sac was excised. There was ascites and evidence of bowel necrosis. I incised the fascia to be able to fully eviscerate and inspect  the bowel.  A bowel resection was performed after  Diving a proximal and distal viable segments using 75 mm GIA blue load stapler. THe mesentery was divided with Impact ligasure device. A side to side functional end to end anastomosis created with 78mm blue stapler. Widely patent anastomosis without tension and good perfusion. No evidence of leak. Mesentery defect closed w 3-0 vicryl.  I closed the hernia defect with interrupted 0 Ethibond sutures. Good hemostasis was observed throughout the case and we control some vessels with cautery. Liposomal Marcaine was used to inject the fascia and subq under direct vision. 15Fr blake drain placed in the sub q above fascia due to massive dead space. Subq closed w 2-0 vicryl and skin closed loosely w staples.  Patient tolerated procedure well and there were no immediate complications. Needle and laparotomy counts were correct   Caroleen Hamman, MD, FACS

## 2018-08-22 NOTE — Anesthesia Postprocedure Evaluation (Signed)
Anesthesia Post Note  Patient: Lynn Hughes  Procedure(s) Performed: HERNIA REPAIR VENTRAL ADULT (N/A Abdomen) SMALL BOWEL RESECTION (N/A Abdomen)  Patient location during evaluation: PACU Anesthesia Type: General Level of consciousness: awake and alert and oriented Pain management: pain level controlled Vital Signs Assessment: post-procedure vital signs reviewed and stable Respiratory status: spontaneous breathing Cardiovascular status: blood pressure returned to baseline Anesthetic complications: no     Last Vitals:  Vitals:   08/22/18 0436 08/22/18 0448  BP: (!) 153/70 (!) 152/79  Pulse:  97  Resp: 20 20  Temp:    SpO2: 99% 95%    Last Pain:  Vitals:   08/22/18 0448  TempSrc:   PainSc: 0-No pain                 Kajah Santizo

## 2018-08-22 NOTE — Anesthesia Post-op Follow-up Note (Signed)
Anesthesia QCDR form completed.        

## 2018-08-22 NOTE — H&P (Signed)
Patient ID: Lynn Hughes, female   DOB: Nov 25, 1960, 58 y.o.   MRN: [id number]  HPI Lynn Hughes is a 58 y.o. female came into the ER with 18 hours of abdominal pain located near the umbilicus.  She has known that she is got a hernia for a few years now but last night started having significant pain.  The pain is severe, persistent and sharp in nature.  She now is unable to reduce her hernia.  No fevers no chills.  She did have a some nausea and abdominal distention. She did have history of a tubal ligation several years ago. CT scan personally reviewed showing evidence of a large incarcerated ventral hernia with a small bowel.  No evidence of free air or necrosis of the bowel.  There is evidence of a large uterine fibroid. CBC showing a white count of 14.2, normal H&H and potassium of 3.1 and glucose of 174.  Rest of the CMP is normal.  HPI  Past Medical History:  Diagnosis Date  . Diabetes mellitus without complication ([place])   . Fibroids   . Hypertension    Surg HX; Tubal ligation  History reviewed. No pertinent family history.  Social History Social History   Tobacco Use  . Smoking status: Never Smoker  . Smokeless tobacco: Never Used  Substance Use Topics  . Alcohol use: Not on file  . Drug use: Not on file    Allergies  Allergen Reactions  . Ace Inhibitors Swelling    Swelling of face and lips  . Codeine Nausea And Vomiting    Current Facility-Administered Medications  Medication Dose Route Frequency Provider Last Rate Last Dose  . piperacillin-tazobactam (ZOSYN) IVPB 3.375 g  3.375 g Intravenous Q8H Gregor Hams, MD 12.5 mL/hr at 08/22/18 0129 3.375 g at 08/22/18 0129   No current outpatient medications on file.     Review of Systems Full ROS  was asked and was negative except for the information on the HPI  Physical Exam Blood pressure (!) 153/88, pulse (!) 110, temperature 98.8 F (37.1 C), temperature source Oral, resp. rate 16, SpO2 98  %. CONSTITUTIONAL: Morbidly obese female EYES: Pupils are equal, round, and reactive to light, Sclera are non-icteric. EARS, NOSE, MOUTH AND THROAT: The oropharynx is clear. The oral mucosa is pink and moist. Hearing is intact to voice. LYMPH NODES:  Lymph nodes in the neck are normal. RESPIRATORY:  Lungs are clear. There is normal respiratory effort, with equal breath sounds bilaterally, and without pathologic use of accessory muscles. CARDIOVASCULAR: Heart is regular without murmurs, gallops, or rubs. GI: There is a large ventral hernia inferior to the umbilicus to the left side.  It is a hard incarcerated hernia with exquisite tenderness to palpation.  I am unable to reduce it at this time. No evidence of overt peritonitis. GU: Rectal deferred.   MUSCULOSKELETAL: Normal muscle strength and tone. No cyanosis or edema.   SKIN: Turgor is good and there are no pathologic skin lesions or ulcers. NEUROLOGIC: Motor and sensation is grossly normal. Cranial nerves are grossly intact. PSYCH:  Oriented to person, place and time. Affect is normal.  Data Reviewed  I have personally reviewed the patient's imaging, laboratory findings and medical records.    Assessment/Plan 58 year old obese female presents with incarcerated ventral hernia with bowel in it.  Currently I do not see any evidence of necrosis or ischemia of the intestines but because of the nature of her disease I do recommend proceeding to the operating  room for an emergent repair.  Procedure discussed with the patient in detail.  Risk, benefits and possible complications including but not limited to: Bleeding, infection, recurrence, the need for bowel resection.  Since she is morbidly obese there is a significant increase of complications.  Unfortunately there is no really not good other alternative given the emergency of the case. We will go ahead start broad-spectrum antibiotics, give her a crystalloid bolus and proceed emergently to the  [place], MD [facility] 08/22/2018, 2:33 AM

## 2018-08-22 NOTE — Progress Notes (Signed)
Notify Dr. Dahlia Byes about patient's NG being clamped at 1020, patient has not complaints of N&V, order to discontinue NG and to start patient to clear liquid diet. Also let him know that patient's potassium is 3.3, order to give potassium rider. RN will continue to monitor.

## 2018-08-22 NOTE — Progress Notes (Signed)
[place] [facility] Day(s): 0.   Post op day(s): Day of Surgery.   Interval History: Patient seen and examined, underwent emergent strangulated ventral hernia repair with small bowel necrosis. Patient reports that she has incisional soreness but her pain is improved compared to presentation. No fever, chills, nausea, or emesis. NGT with about 300 ccs out but nothing recorded in chart. No reports of bowel functions.    Vital signs in last 24 hours: [min-max] current  Temp:  [98.2 F (36.8 C)-99.2 F (37.3 C)] 98.2 F (36.8 C) (07/16 0552) Pulse Rate:  [90-118] 95 (07/16 0552) Resp:  [16-20] 18 (07/16 0528) BP: (135-172)/(59-98) 141/85 (07/16 0552) SpO2:  [86 %-100 %] 99 % (07/16 0552) Weight:  [123.1 kg] 123.1 kg (07/16 0552)     Height: 4\' 11"  (149.9 cm) Weight: 123.1 kg BMI (Calculated): 54.77   Intake/Output last 2 shifts:  07/15 0701 - 07/16 0700 In: 800 [I.V.:800] Out: 175 [Urine:125; Blood:50]   Physical Exam:  Constitutional: alert, cooperative and no distress  HEENT: NGT in place Respiratory: breathing non-labored at rest  Cardiovascular: regular rate and sinus rhythm  Gastrointestinal: soft, incisional soreness, and non-distended. No rebound / guarding. JP Drain in RLQ with serosanguinous fluid  Integumentary: horizontal laparotomy incision is CDI with honeycomb dressing, no erythema, no drainage.   Labs:  CBC Latest Ref Rng & Units 08/21/2018  WBC 4.0 - 10.5 K/uL 14.2(H)  Hemoglobin 12.0 - 15.0 g/dL 12.9  Hematocrit 36.0 - 46.0 % 40.0  Platelets 150 - 400 K/uL 352   CMP Latest Ref Rng & Units 08/21/2018  Glucose 70 - 99 mg/dL 174(H)  BUN 6 - 20 mg/dL 16  Creatinine 0.44 - 1.00 mg/dL 0.61  Sodium 135 - 145 mmol/L 137  Potassium 3.5 - 5.1 mmol/L 3.1(L)  Chloride 98 - 111 mmol/L 98  CO2 22 - 32 mmol/L 28  Calcium 8.9 - 10.3 mg/dL 9.4  Total Protein 6.5 - 8.1 g/dL 8.2(H)  Total Bilirubin 0.3 - 1.2 mg/dL 0.6  Alkaline  Phos 38 - 126 U/L 71  AST 15 - 41 U/L 16  ALT 0 - 44 U/L 12    Imaging studies: No new pertinent imaging studies   Assessment/Plan:  58 y.o. female overall doing well with expected post-surgical soreness awaiting return of bowel function Day of Surgery s/p open ventral hernia repair for strangulated ventral hernia with bowel necrosis   - Continue NPO + IVF for now  - Continue NGT decompression for now while awaiting bowel function return.    - IV ABx (Zosyn)  - pain control prn (minimize narcotics); antiemetics prn  - monitor abdominal examination; on-going bowel function   - medical management of comorbidities  - mobilization encouraged  - DVT prophylaxis   All of the above findings and recommendations were discussed with the patient, and the medical team, and all of patient's questions were answered to her expressed satisfaction.  -- Edison Simon, PA-C [facility] 08/22/2018, 7:23 AM [phone] M-F: 7am - 4pm

## 2018-08-22 NOTE — Plan of Care (Signed)
  Problem: Education: Goal: Knowledge of General Education information will improve Description: Including pain rating scale, medication(s)/side effects and non-pharmacologic comfort measures Outcome: Progressing   Problem: Health Behavior/Discharge Planning: Goal: Ability to manage health-related needs will improve Outcome: Progressing   Problem: Clinical Measurements: Goal: Ability to maintain clinical measurements within normal limits will improve Outcome: Progressing Goal: Will remain free from infection Outcome: Progressing Goal: Diagnostic test results will improve Outcome: Progressing Goal: Respiratory complications will improve Outcome: Progressing Goal: Cardiovascular complication will be avoided Outcome: Progressing   Problem: Pain Managment: Goal: General experience of comfort will improve Outcome: Progressing   Problem: Safety: Goal: Ability to remain free from injury will improve Outcome: Progressing   Problem: Education: Goal: Required Educational Video(s) Outcome: Progressing   Problem: Clinical Measurements: Goal: Postoperative complications will be avoided or minimized Outcome: Progressing   Problem: Skin Integrity: Goal: Demonstration of wound healing without infection will improve Outcome: Progressing

## 2018-08-22 NOTE — Plan of Care (Signed)
  Problem: Education: Goal: Knowledge of General Education information will improve Description: Including pain rating scale, medication(s)/side effects and non-pharmacologic comfort measures Outcome: Progressing Note: Patient admitted at 0530 from PACU. Report completed at bedside. No complaint of pain. Patient profile completed. Wound assessed no interventions needed.

## 2018-08-22 NOTE — Anesthesia Preprocedure Evaluation (Addendum)
Anesthesia Evaluation  Patient identified by MRN, date of birth, ID band Patient awake    Reviewed: Allergy & Precautions, NPO status , Patient's Chart, lab work & pertinent test results  Airway Mallampati: III  TM Distance: <3 FB     Dental  (+) Chipped   Pulmonary neg pulmonary ROS,    Pulmonary exam normal        Cardiovascular hypertension, Pt. on medications Normal cardiovascular exam     Neuro/Psych negative neurological ROS  negative psych ROS   GI/Hepatic Neg liver ROS,   Endo/Other  diabetes  Renal/GU negative Renal ROS  negative genitourinary   Musculoskeletal   Abdominal Normal abdominal exam  (+)   Peds negative pediatric ROS (+)  Hematology negative hematology ROS (+)   Anesthesia Other Findings   Reproductive/Obstetrics                            Anesthesia Physical Anesthesia Plan  ASA: III and emergent  Anesthesia Plan: General   Post-op Pain Management:    Induction: Intravenous, Rapid sequence and Cricoid pressure planned  PONV Risk Score and Plan:   Airway Management Planned: Oral ETT  Additional Equipment:   Intra-op Plan:   Post-operative Plan: Extubation in OR  Informed Consent: I have reviewed the patients History and Physical, chart, labs and discussed the procedure including the risks, benefits and alternatives for the proposed anesthesia with the patient or authorized representative who has indicated his/her understanding and acceptance.     Dental advisory given  Plan Discussed with: CRNA and Surgeon  Anesthesia Plan Comments:         Anesthesia Quick Evaluation

## 2018-08-23 ENCOUNTER — Encounter: Payer: Self-pay | Admitting: Surgery

## 2018-08-23 LAB — SURGICAL PATHOLOGY

## 2018-08-23 LAB — CBC
HCT: 35.2 % — ABNORMAL LOW (ref 36.0–46.0)
Hemoglobin: 10.8 g/dL — ABNORMAL LOW (ref 12.0–15.0)
MCH: 26.9 pg (ref 26.0–34.0)
MCHC: 30.7 g/dL (ref 30.0–36.0)
MCV: 87.6 fL (ref 80.0–100.0)
Platelets: 285 10*3/uL (ref 150–400)
RBC: 4.02 MIL/uL (ref 3.87–5.11)
RDW: 17.4 % — ABNORMAL HIGH (ref 11.5–15.5)
WBC: 10.3 10*3/uL (ref 4.0–10.5)
nRBC: 0 % (ref 0.0–0.2)

## 2018-08-23 LAB — HIV ANTIBODY (ROUTINE TESTING W REFLEX): HIV Screen 4th Generation wRfx: NONREACTIVE

## 2018-08-23 LAB — BASIC METABOLIC PANEL
Anion gap: 7 (ref 5–15)
BUN: 23 mg/dL — ABNORMAL HIGH (ref 6–20)
CO2: 29 mmol/L (ref 22–32)
Calcium: 7.9 mg/dL — ABNORMAL LOW (ref 8.9–10.3)
Chloride: 105 mmol/L (ref 98–111)
Creatinine, Ser: 1.37 mg/dL — ABNORMAL HIGH (ref 0.44–1.00)
GFR calc Af Amer: 49 mL/min — ABNORMAL LOW (ref >60)
GFR calc non Af Amer: 42 mL/min — ABNORMAL LOW (ref >60)
Glucose, Bld: 158 mg/dL — ABNORMAL HIGH (ref 70–99)
Potassium: 3.6 mmol/L (ref 3.5–5.1)
Sodium: 141 mmol/L (ref 135–145)

## 2018-08-23 LAB — GLUCOSE, CAPILLARY
Glucose-Capillary: 137 mg/dL — ABNORMAL HIGH (ref 70–99)
Glucose-Capillary: 172 mg/dL — ABNORMAL HIGH (ref 70–99)
Glucose-Capillary: 208 mg/dL — ABNORMAL HIGH (ref 70–99)

## 2018-08-23 NOTE — Plan of Care (Signed)
Patient POD1, no c/o pain, ambulated outside of room around nurses station with standby assistance three times, tolerated well. Tolerating diet. JP drain with minimal output.   Problem: Pain Managment: Goal: General experience of comfort will improve Outcome: Progressing   Problem: Safety: Goal: Ability to remain free from injury will improve Outcome: Progressing   Problem: Education: Goal: Required Educational Video(s) Outcome: Progressing   Problem: Clinical Measurements: Goal: Postoperative complications will be avoided or minimized Outcome: Progressing   Problem: Skin Integrity: Goal: Demonstration of wound healing without infection will improve Outcome: Progressing

## 2018-08-23 NOTE — Progress Notes (Signed)
Ch visited pt after receiving a referral from SW. Pt was tearful yet thankful for a successful surgery. Pt stated that she had emergency surgery on her hernia yesterday. Pt stated that it was a procedure that she has been putting off for a while. Pt has recently retired over a year ago and just transitioned to [place] from [place] to be closer to her mother. Pt is a person of faith and expressed gratitude for how well she has recovered post-op. Ch prayed for pt to fully recover, for her family, and for all the care providers that come in her presence. Pt was appreciative of visit. Ch allowed pt time to complete her lunch and shared that she could hv her nurse pg a ch if she has further needs.    08/23/18 1200  Clinical Encounter Type  Visited With Patient  Visit Type Psychological support;Spiritual support;Social support;Post-op  Referral From Social work  Consult/Referral To [employer]  Spiritual Encounters  Spiritual Needs Prayer;Emotional;Grief support  Stress Factors  Patient Stress Factors Family relationships;Health changes;Major life changes  Family Stress Factors None identified

## 2018-08-23 NOTE — Progress Notes (Signed)
[place] [facility] Day(s): 1.   Post op day(s): 1 Day Post-Op.   Interval History: Patient seen and examined, no acute events or new complaints overnight. Patient reports she is feeling much better this morning. She noted mild incisional soreness. No fever, chills, nausea, or emesis. She tolerated clear liquids without issue and is asking for regular food. She endorses flatus and a BM. Mobilizing well. No other complaints.    Vital signs in last 24 hours: [min-max] current  Temp:  [98.3 F (36.8 C)-98.8 F (37.1 C)] 98.8 F (37.1 C) (07/17 0420) Pulse Rate:  [83-95] 83 (07/17 0749) Resp:  [16-23] 19 (07/17 0749) BP: (102-129)/(51-73) 102/51 (07/17 0749) SpO2:  [87 %-97 %] 97 % (07/17 0749)     Height: 4\' 11"  (149.9 cm) Weight: 123.1 kg BMI (Calculated): 54.77   Intake/Output last 2 shifts:  07/16 0701 - 07/17 0700 In: 1383.9 [I.V.:1254.6; IV Piggyback:129.3] Out: 780 [Urine:750; Drains:30]   Physical Exam:  Constitutional: alert, cooperative and no distress  Respiratory: breathing non-labored at rest  Gastrointestinal: soft, mild incisional soreness, and non-distended. No rebound/guarding. JP in suprapubic region with serosanguinous drainage.  Integumentary: Horizontal laparotomy incision is CDI with staples, no erythema or drainage.    Labs:  CBC Latest Ref Rng & Units 08/23/2018 08/22/2018 08/21/2018  WBC 4.0 - 10.5 K/uL 10.3 15.4(H) 14.2(H)  Hemoglobin 12.0 - 15.0 g/dL 10.8(L) 12.4 12.9  Hematocrit 36.0 - 46.0 % 35.2(L) 39.8 40.0  Platelets 150 - 400 K/uL 285 354 352   CMP Latest Ref Rng & Units 08/23/2018 08/22/2018 08/21/2018  Glucose 70 - 99 mg/dL 158(H) 171(H) 174(H)  BUN 6 - 20 mg/dL 23(H) 15 16  Creatinine 0.44 - 1.00 mg/dL 1.37(H) 0.88 0.61  Sodium 135 - 145 mmol/L 141 139 137  Potassium 3.5 - 5.1 mmol/L 3.6 3.3(L) 3.1(L)  Chloride 98 - 111 mmol/L 105 100 98  CO2 22 - 32 mmol/L 29 29 28   Calcium 8.9 - 10.3 mg/dL 7.9(L)  8.6(L) 9.4  Total Protein 6.5 - 8.1 g/dL - - 8.2(H)  Total Bilirubin 0.3 - 1.2 mg/dL - - 0.6  Alkaline Phos 38 - 126 U/L - - 71  AST 15 - 41 U/L - - 16  ALT 0 - 44 U/L - - 12     Imaging studies: No new pertinent imaging studies   Assessment/Plan:  58 y.o. female overall doing well with return of bowel function and tolerating diet 1 Day Post-Op s/p exploratory laparotomy, small bowel resection, and ventral, hernia repair for strangulated ventral hernia with evidence of bowel necrosis.   - Soft diet + Wean IVF (slight AKI today so left on)  - pain control prn  - monitor abdominal examination; on-going bowel function  - mobilization encouraged  - medical management of comorbidities   - Discharge Planning: From surgical standpoint okay for discharge in 24 hours, patient would like to stay one more day. Can follow up next week with Pabon. Home with Drain + 7 days ABx (Augmentin)   All of the above findings and recommendations were discussed with the patient, and the medical team, and all of patient's questions were answered to her expressed satisfaction.  -- Edison Simon, PA-C [facility] 08/23/2018, 12:06 PM [phone] M-F: 7am - 4pm

## 2018-08-24 LAB — CBC
HCT: 34.4 % — ABNORMAL LOW (ref 36.0–46.0)
Hemoglobin: 10.8 g/dL — ABNORMAL LOW (ref 12.0–15.0)
MCH: 27.1 pg (ref 26.0–34.0)
MCHC: 31.4 g/dL (ref 30.0–36.0)
MCV: 86.4 fL (ref 80.0–100.0)
Platelets: 293 K/uL (ref 150–400)
RBC: 3.98 MIL/uL (ref 3.87–5.11)
RDW: 17.2 % — ABNORMAL HIGH (ref 11.5–15.5)
WBC: 10.1 K/uL (ref 4.0–10.5)
nRBC: 0 % (ref 0.0–0.2)

## 2018-08-24 LAB — GLUCOSE, CAPILLARY
Glucose-Capillary: 146 mg/dL — ABNORMAL HIGH (ref 70–99)
Glucose-Capillary: 125 mg/dL — ABNORMAL HIGH (ref 70–99)

## 2018-08-24 LAB — BASIC METABOLIC PANEL
Anion gap: 6 (ref 5–15)
BUN: 21 mg/dL — ABNORMAL HIGH (ref 6–20)
CO2: 28 mmol/L (ref 22–32)
Calcium: 8.4 mg/dL — ABNORMAL LOW (ref 8.9–10.3)
Chloride: 109 mmol/L (ref 98–111)
Creatinine, Ser: 0.88 mg/dL (ref 0.44–1.00)
GFR calc Af Amer: >60 mL/min (ref >60)
GFR calc non Af Amer: >60 mL/min (ref >60)
Glucose, Bld: 161 mg/dL — ABNORMAL HIGH (ref 70–99)
Potassium: 4.5 mmol/L (ref 3.5–5.1)
Sodium: 143 mmol/L (ref 135–145)

## 2018-08-24 MED ORDER — HYDROCODONE-ACETAMINOPHEN 5-325 MG PO TABS: 1.0000 | ORAL_TABLET | ORAL | 0 refills | Status: AC | PRN

## 2018-08-24 MED ORDER — AMOXICILLIN-POT CLAVULANATE 875-125 MG PO TABS: 1.0000 | ORAL_TABLET | Freq: Two times a day (BID) | ORAL | 0 refills | Status: AC

## 2018-08-24 NOTE — Plan of Care (Signed)
  Problem: Clinical Measurements: Goal: Respiratory complications will improve Outcome: Progressing   Problem: Pain Managment: Goal: General experience of comfort will improve Outcome: Progressing   Problem: Clinical Measurements: Goal: Postoperative complications will be avoided or minimized Outcome: Progressing   Problem: Skin Integrity: Goal: Demonstration of wound healing without infection will improve Outcome: Progressing

## 2018-08-24 NOTE — Progress Notes (Signed)
Pt discharged to home via wc.  Instructions given to pt.  Demonstrated dressing change and how to empty and record JP drainage. Questions answered.  No distress.

## 2018-08-24 NOTE — Discharge Instructions (Signed)
°  Diet: Resume home heart healthy regular diet.   Activity: No heavy lifting >20 pounds (children, pets, laundry, garbage) or strenuous activity until follow-up, but light activity and walking are encouraged. Do not drive or drink alcohol if taking narcotic pain medications.  Wound care: May shower with soapy water and pat dry (do not rub incisions), but no baths or submerging incision underwater until follow-up. (no swimming)   Medications: Resume all home medications. For mild to moderate pain: acetaminophen (Tylenol) or ibuprofen (if no kidney disease). Combining Tylenol with alcohol can substantially increase your risk of causing liver disease. Narcotic pain medications, if prescribed, can be used for severe pain, though may cause nausea, constipation, and drowsiness. Do not combine Tylenol and Norco within a 6 hour period as Norco contains Tylenol. If you do not need the narcotic pain medication, you do not need to fill the prescription.

## 2018-08-24 NOTE — Discharge Summary (Signed)
  Patient ID: Lynn Hughes MRN: [id number] DOB/AGE: October 14, 1960 58 y.o.  Admit date: 08/21/2018 Discharge date: 08/24/2018   Discharge Diagnoses:  Active Problems:   S/P repair of ventral hernia   Procedures: Strangulated ventral hernia repair with small bowel resection and anastomosis  Hospital Course: Patient admitted with strangulated ventral hernia.  She was taken to the OR for surgical management.  He underwent ventral hernia repair and small bowel resection.  She has been recovering well.  Today she is tolerating solid diet, passing gas, pain control.  The wound is dry and clean without sign of infection.  Drainage with serosanguineous secretions.  Drainage will be kept in place.  Patient will be discharged and follow-up by Dr. Dahlia Byes within 1 week.  Physical Exam  Constitutional: She is oriented to person, place, and time and well-developed, well-nourished, and in no distress.  Cardiovascular: Normal rate.  Pulmonary/Chest: Effort normal.  Abdominal: Soft. Bowel sounds are normal. She exhibits no distension. There is no abdominal tenderness. There is no rebound.  Neurological: She is alert and oriented to person, place, and time.  Skin: Skin is warm.  Wound is dry and clean, no erythema.   Consults: None  Disposition: Discharge disposition: 01-Home or Self Care       Discharge Instructions    Diet - low sodium heart healthy   Complete by: As directed      Allergies as of 08/24/2018      Reactions   Ace Inhibitors Swelling   Swelling of face and lips   Codeine Nausea And Vomiting      Medication List    TAKE these medications   amLODipine 10 MG tablet Commonly known as: NORVASC Take 10 mg by mouth daily.   amoxicillin-clavulanate 875-125 MG tablet Commonly known as: Augmentin Take 1 tablet by mouth every 12 (twelve) hours for 7 days.   atorvastatin 10 MG tablet Commonly known as: LIPITOR Take 10 mg by mouth daily.   fluticasone 50 MCG/ACT nasal  spray Commonly known as: FLONASE Place 1 spray into both nostrils daily.   HYDROcodone-acetaminophen 5-325 MG tablet Commonly known as: Norco Take 1 tablet by mouth every 4 (four) hours as needed for up to 3 days for moderate pain.   metFORMIN 500 MG 24 hr tablet Commonly known as: GLUCOPHAGE-XR Take 500 mg by mouth daily with breakfast.   metoprolol succinate 50 MG 24 hr tablet Commonly known as: TOPROL-XL Take 50 mg by mouth daily. Take with or immediately following a meal.   triamterene-hydrochlorothiazide 37.5-25 MG tablet Commonly known as: MAXZIDE-25 Take 1 tablet by mouth daily.      Follow-up Information    Pabon, [place] F, MD. Schedule an appointment as soon as possible for a visit in 1 week(s).   Specialty: General Surgery Why: s/p strangulated ventral hernia repair + small bowel resection, has drain Contact information: [address] [place] [address] [phone]          This discharge encounter was more than 30 minutes most of the time counseling the patient and coordinating plan of care.

## 2018-08-24 NOTE — Plan of Care (Signed)
  Problem: Education: Goal: Knowledge of General Education information will improve Description: Including pain rating scale, medication(s)/side effects and non-pharmacologic comfort measures Outcome: Completed/Met   Problem: Health Behavior/Discharge Planning: Goal: Ability to manage health-related needs will improve Outcome: Completed/Met   Problem: Clinical Measurements: Goal: Ability to maintain clinical measurements within normal limits will improve Outcome: Completed/Met Goal: Will remain free from infection Outcome: Completed/Met Goal: Diagnostic test results will improve Outcome: Completed/Met Goal: Respiratory complications will improve Outcome: Completed/Met Goal: Cardiovascular complication will be avoided Outcome: Completed/Met   Problem: Pain Managment: Goal: General experience of comfort will improve Outcome: Completed/Met   Problem: Safety: Goal: Ability to remain free from injury will improve Outcome: Completed/Met   Problem: Education: Goal: Required Educational Video(s) Outcome: Completed/Met   Problem: Clinical Measurements: Goal: Postoperative complications will be avoided or minimized Outcome: Completed/Met   Problem: Skin Integrity: Goal: Demonstration of wound healing without infection will improve Outcome: Completed/Met

## 2018-08-26 LAB — GLUCOSE, CAPILLARY: Glucose-Capillary: 123 mg/dL — ABNORMAL HIGH (ref 70–99)

## 2018-08-28 ENCOUNTER — Other Ambulatory Visit: Payer: Self-pay

## 2018-08-28 ENCOUNTER — Ambulatory Visit (INDEPENDENT_AMBULATORY_CARE_PROVIDER_SITE_OTHER): Payer: 59 | Admitting: Surgery

## 2018-08-28 ENCOUNTER — Encounter: Payer: Self-pay | Admitting: Surgery

## 2018-08-28 VITALS — BP 155/85 | HR 98 | Temp 97.5°F | Ht 59.0 in | Wt 266.8 lb

## 2018-08-28 DIAGNOSIS — Z09 Encounter for follow-up examination after completed treatment for conditions other than malignant neoplasm: Secondary | ICD-10-CM

## 2018-08-28 NOTE — Progress Notes (Signed)
S/p strangulated ventral hernia requiring small bowel resection.  She is doing very well.  Taking p.o.  Minimal output from drain about 15 cc No fevers no chills.  Taking p.o. Path reviewed  PE NAD Abd: soft, nt, incisions c/d/i, staples intact. JP removed  A/p Doing very well rtc next week to remove staples No heavy lifting D/w her the high likely hood of recurrence given tissue repair

## 2018-08-28 NOTE — Patient Instructions (Signed)
Follow up in one week

## 2018-09-04 ENCOUNTER — Encounter: Payer: Self-pay | Admitting: Physician Assistant

## 2018-09-04 ENCOUNTER — Other Ambulatory Visit: Payer: Self-pay

## 2018-09-04 ENCOUNTER — Ambulatory Visit (INDEPENDENT_AMBULATORY_CARE_PROVIDER_SITE_OTHER): Payer: 59 | Admitting: Physician Assistant

## 2018-09-04 VITALS — BP 176/94 | HR 97 | Temp 97.9°F | Resp 16 | Ht 59.0 in | Wt 265.8 lb

## 2018-09-04 DIAGNOSIS — Z8719 Personal history of other diseases of the digestive system: Secondary | ICD-10-CM

## 2018-09-04 DIAGNOSIS — Z9889 Other specified postprocedural states: Secondary | ICD-10-CM

## 2018-09-04 DIAGNOSIS — Z09 Encounter for follow-up examination after completed treatment for conditions other than malignant neoplasm: Secondary | ICD-10-CM

## 2018-09-04 NOTE — Progress Notes (Signed)
[facility] POST-OP OFFICE VISIT  09/04/2018  HPI: Meline Russaw is a 58 y.o. female 2 weeks s/p ventral hernia repair and small bowel resection secondary to strangulated ventral hernia. She is overall doing well. No abdominal pain, nausea, or emesis. Continues having bowel function. Not requiring pain medications.   Vital signs: BP (!) 176/94   Pulse 97   Temp 97.9 F (36.6 C) (Temporal)   Resp 16   Ht 4\' 11"  (1.499 m)   Wt 265 lb 12.8 oz (120.6 kg)   SpO2 94%   BMI 53.69 kg/m    Physical Exam: Constitutional: Well appearing female, NAD  Abdomen: Soft, non-tender, non-distended, no ventral hernia recurrence Skin: Horizontal laparotomy incision inferior to umbilicus is well healed, staples removed, no erythema or drainage  Assessment/Plan: This is a 58 y.o. female 2 weeks s/p ventral hernia repair and small bowel resection secondary to strangulated ventral hernia   - Staples removed, steri-strips placed  - Okay to shower, do not scrub, pat dry  - Complete 6 weeks of no heavy lifting  - Not requiring pain medications  - Okay to drive if not having pain  - RTC PRN  -- Edison Simon, PA-C [facility] 09/04/2018, 9:14 AM [phone] M-F: 7am - 4pm

## 2018-09-04 NOTE — Patient Instructions (Signed)

## 2019-09-01 NOTE — Progress Notes (Signed)
New patient visit   Patient: Lynn Hughes   DOB: [birth year]   59 y.o. Female  MRN: [id number] Visit Date: 09/02/2019  Today's healthcare provider: Marcille Buffy, FNP   Chief Complaint  Patient presents with  . New Patient (Initial Visit)   Subjective    Lynn Hughes is a 59 y.o. female who presents today as a new patient to establish care.  HPI  Patient is a 73 female in no acute distress, who moved to the area and here to establish care.   Overdue for mammogram has not had since covid started.  Needs order.   She wants order for colonoscopy.  She has never had one.  Denies any rectal bleeding.   She has hyperlipidemia and hypertension. She is on Atorvastatin 10 mg daily, she has been off due to telehealh she was using would only give three prescripiptions.   She says she was also on Metformin prescribed by her PCP for diabetes, and she had nausea she could not tolerate and discontinue. She checks her blood sugars at home 120 to 200.  Mostly 120's she says.   History of hematuria and saw urology and cystocopy and she was told everything was normal. She reports that was years ago.   History of fibroids.  She has uterus and and ovaries. Last PAP smear - and was 2 years ago normal. Wants gynecology.    Patient  denies any fever, body aches,chills, rash, chest pain, shortness of breath, nausea, vomiting, or diarrhea.   Hernia surgery May 2020/   Past Medical History:  Diagnosis Date  . Allergy   . Anemia   . Anxiety   . Arthritis   . Diabetes mellitus without complication ([place])   . Fibroids   . Heart murmur   . Hypertension    Past Surgical History:  Procedure Laterality Date  . BOWEL RESECTION N/A 08/22/2018   Procedure: SMALL BOWEL RESECTION;  Surgeon: Jules Husbands, MD;  Location: [facility] ORS;  Service: General;  Laterality: N/A;  . VENTRAL HERNIA REPAIR N/A 08/22/2018   Procedure: HERNIA REPAIR VENTRAL ADULT;  Surgeon: Jules Husbands, MD;   Location: [facility] OR[facility];  Service: General;  Laterality: N/A;   Family Status  Relation Name Status  . Mother  Alive  . Father  Alive  . Mat Aunt  (Not Specified)  . Ethlyn Daniels  (Not Specified)  . Annamarie Major  (Not Specified)  . MGM  (Not Specified)   Family History  Problem Relation Age of Onset  . Cancer Mother   . Prostate cancer Father   . Cancer Father   . Cancer Maternal Aunt   . Cancer Paternal Aunt   . Heart attack Paternal Uncle   . Cirrhosis Maternal Grandmother    Social History   Socioeconomic History  . Marital status: Single    Spouse name: Not on file  . Number of children: Not on file  . Years of education: Not on file  . Highest education level: Not on file  Occupational History  . Not on file  Tobacco Use  . Smoking status: Former Smoker    Quit date: 09/04/1986    Years since quitting: 33.0  . Smokeless tobacco: Never Used  Substance and Sexual Activity  . Alcohol use: Not on file    Comment: occassionally  . Drug use: Never  . Sexual activity: Not on file  Other Topics Concern  . Not on file  Social History Narrative  .  Not on file   Social Determinants of Health   Financial Resource Strain:   . Difficulty of Paying Living Expenses:   Food Insecurity:   . Worried About Charity fundraiser in the Last Year:   . Arboriculturist in the Last Year:   Transportation Needs:   . Film/video editor (Medical):   Marland Kitchen Lack of Transportation (Non-Medical):   Physical Activity:   . Days of Exercise per Week:   . Minutes of Exercise per Session:   Stress:   . Feeling of Stress :   Social Connections:   . Frequency of Communication with Friends and Family:   . Frequency of Social Gatherings with Friends and Family:   . Attends Religious Services:   . Active Member of Clubs or Organizations:   . Attends Archivist Meetings:   Marland Kitchen Marital Status:    Outpatient Medications Prior to Visit  Medication Sig  . [DISCONTINUED] amLODipine (NORVASC) 10  MG tablet Take 10 mg by mouth daily.  . [DISCONTINUED] atorvastatin (LIPITOR) 10 MG tablet Take 10 mg by mouth daily.  . [DISCONTINUED] metoprolol succinate (TOPROL-XL) 50 MG 24 hr tablet Take 50 mg by mouth daily. Take with or immediately following a meal.  . [DISCONTINUED] triamterene-hydrochlorothiazide (MAXZIDE-25) 37.5-25 MG tablet Take 1 tablet by mouth daily.  . [DISCONTINUED] fluticasone (FLONASE) 50 MCG/ACT nasal spray Place 1 spray into both nostrils daily. (Patient not taking: Reported on 09/02/2019)  . [DISCONTINUED] metFORMIN (GLUCOPHAGE-XR) 500 MG 24 hr tablet Take 500 mg by mouth daily with breakfast. (Patient not taking: Reported on 09/02/2019)   No facility-administered medications prior to visit.   Allergies  Allergen Reactions  . Ace Inhibitors Swelling    Swelling of face and lips  . Codeine Nausea And Vomiting     There is no immunization history on file for this patient.  Health Maintenance  Topic Date Due  . Hepatitis C Screening  Never done  . COVID-19 Vaccine (1) Never done  . TETANUS/TDAP  Never done  . PAP SMEAR-Modifier  Never done  . MAMMOGRAM  Never done  . COLONOSCOPY  Never done  . INFLUENZA VACCINE  09/07/2019  . HIV Screening  Completed    Patient Care Team: Kijana Cromie, Kelby Aline, FNP as PCP - General (Family Medicine)  Review of Systems  Constitutional: Negative.   HENT: Negative.   Respiratory: Negative.   Cardiovascular: Negative.   Gastrointestinal: Negative.   Genitourinary: Negative.        History of hematuria microscopic seen urology years ago for cystoscopy reports was normal  No follow up since. She denies any gross hematuria.   Hematological: Negative.   Psychiatric/Behavioral: Negative.   All other systems reviewed and are negative.     Objective    BP (!) 140/82   Pulse 85   Temp (!) 96.8 F (36 C) (Oral)   Resp 16   Ht 4\' 11"  (1.499 m)   Wt (!) 271 lb 6.4 oz (123.1 kg)   SpO2 95%   BMI 54.82 kg/m  Physical  Exam Vitals reviewed.  Constitutional:      General: She is not in acute distress.    Appearance: She is well-developed. She is obese. She is not ill-appearing, toxic-appearing or diaphoretic.     Interventions: She is not intubated. HENT:     Head: Normocephalic and atraumatic.     Right Ear: External ear normal.     Left Ear: External ear normal.  Nose: Nose normal.     Mouth/Throat:     Mouth: Mucous membranes are moist.     Pharynx: No oropharyngeal exudate or posterior oropharyngeal erythema.  Eyes:     General: Lids are normal. No scleral icterus.       Right eye: No discharge.        Left eye: No discharge.     Extraocular Movements: Extraocular movements intact.     Conjunctiva/sclera: Conjunctivae normal.     Right eye: Right conjunctiva is not injected. No exudate or hemorrhage.    Left eye: Left conjunctiva is not injected. No exudate or hemorrhage.    Pupils: Pupils are equal, round, and reactive to light.  Neck:     Thyroid: No thyroid mass or thyromegaly.     Vascular: Normal carotid pulses. No carotid bruit, hepatojugular reflux or JVD.     Trachea: Trachea and phonation normal. No tracheal tenderness or tracheal deviation.     Meningeal: Brudzinski's sign and Kernig's sign absent.  Cardiovascular:     Rate and Rhythm: Normal rate and regular rhythm.     Pulses: Normal pulses.          Radial pulses are 2+ on the right side and 2+ on the left side.       Dorsalis pedis pulses are 2+ on the right side and 2+ on the left side.       Posterior tibial pulses are 2+ on the right side and 2+ on the left side.     Heart sounds: Normal heart sounds, S1 normal and S2 normal. Heart sounds not distant. No murmur heard.  No friction rub. No gallop.   Pulmonary:     Effort: Pulmonary effort is normal. No tachypnea, bradypnea, accessory muscle usage or respiratory distress. She is not intubated.     Breath sounds: Normal breath sounds. No stridor. No wheezing, rhonchi or  rales.  Chest:     Chest wall: No tenderness.  Abdominal:     General: Bowel sounds are normal. There is no distension or abdominal bruit.     Palpations: Abdomen is soft. There is no shifting dullness, fluid wave, hepatomegaly, splenomegaly, mass or pulsatile mass.     Tenderness: There is no abdominal tenderness. There is no right CVA tenderness, left CVA tenderness, guarding or rebound.     Hernia: No hernia is present.  Musculoskeletal:        General: No tenderness or deformity. Normal range of motion.     Cervical back: Full passive range of motion without pain, normal range of motion and neck supple. No edema, erythema or rigidity. No spinous process tenderness or muscular tenderness. Normal range of motion.     Right lower leg: No edema.     Left lower leg: No edema.  Lymphadenopathy:     Head:     Right side of head: No submental, submandibular, tonsillar, preauricular, posterior auricular or occipital adenopathy.     Left side of head: No submental, submandibular, tonsillar, preauricular, posterior auricular or occipital adenopathy.     Cervical: No cervical adenopathy.     Right cervical: No superficial, deep or posterior cervical adenopathy.    Left cervical: No superficial, deep or posterior cervical adenopathy.     Upper Body:     Right upper body: No supraclavicular or pectoral adenopathy.     Left upper body: No supraclavicular or pectoral adenopathy.  Skin:    General: Skin is warm and dry.  Capillary Refill: Capillary refill takes less than 2 seconds.     Coloration: Skin is not jaundiced or pale.     Findings: No abrasion, bruising, burn, ecchymosis, erythema, lesion, petechiae or rash.     Nails: There is no clubbing.  Neurological:     General: No focal deficit present.     Mental Status: She is alert. Mental status is at baseline.     GCS: GCS eye subscore is 4. GCS verbal subscore is 5. GCS motor subscore is 6.     Cranial Nerves: No cranial nerve deficit.      Sensory: No sensory deficit.     Motor: No weakness, tremor, atrophy, abnormal muscle tone or seizure activity.     Coordination: Coordination normal.     Gait: Gait normal.     Deep Tendon Reflexes: Reflexes are normal and symmetric. Reflexes normal. Babinski sign absent on the right side. Babinski sign absent on the left side.     Reflex Scores:      Tricep reflexes are 2+ on the right side and 2+ on the left side.      Bicep reflexes are 2+ on the right side and 2+ on the left side.      Brachioradialis reflexes are 2+ on the right side and 2+ on the left side.      Patellar reflexes are 2+ on the right side and 2+ on the left side.      Achilles reflexes are 2+ on the right side and 2+ on the left side. Psychiatric:        Mood and Affect: Mood normal.        Speech: Speech normal.        Behavior: Behavior normal.        Thought Content: Thought content normal.        Judgment: Judgment normal.     Depression Screen PHQ 2/9 Scores 09/02/2019  PHQ - 2 Score 0  PHQ- 9 Score 2   Results for orders placed or performed in visit on 09/02/19  CBC with Differential/Platelet  Result Value Ref Range   WBC 8.9 3.4 - 10.8 x10E3/uL   RBC 5.09 3.77 - 5.28 x10E6/uL   Hemoglobin 13.6 11.1 - 15.9 g/dL   Hematocrit 42.1 34.0 - 46.6 %   MCV 83 79 - 97 fL   MCH 26.7 26.6 - 33.0 pg   MCHC 32.3 31 - 35 g/dL   RDW 15.8 (H) 11.7 - 15.4 %   Platelets 377 150 - 450 x10E3/uL   Neutrophils 82 Not Estab. %   Lymphs 11 Not Estab. %   Monocytes 5 Not Estab. %   Eos 1 Not Estab. %   Basos 1 Not Estab. %   Neutrophils Absolute 7.3 (H) 1 - 7 x10E3/uL   Lymphocytes Absolute 0.9 0 - 3 x10E3/uL   Monocytes Absolute 0.4 0 - 0 x10E3/uL   EOS (ABSOLUTE) 0.1 0.0 - 0.4 x10E3/uL   Basophils Absolute 0.1 0 - 0 x10E3/uL   Immature Granulocytes 0 Not Estab. %   Immature Grans (Abs) 0.0 0.0 - 0.1 x10E3/uL  Comprehensive metabolic panel  Result Value Ref Range   Glucose 131 (H) 65 - 99 mg/dL   BUN 9 8 -  27 mg/dL   Creatinine, Ser 0.89 0.57 - 1.00 mg/dL   GFR calc non Af Amer 71 >59 mL/min/1.73   GFR calc Af Amer 81 >59 mL/min/1.73   BUN/Creatinine Ratio 10 (L) 12 - 28  Sodium 141 134 - 144 mmol/L   Potassium 4.1 3.5 - 5.2 mmol/L   Chloride 99 96 - 106 mmol/L   CO2 28 20 - 29 mmol/L   Calcium 9.8 8.7 - 10.3 mg/dL   Total Protein 7.7 6.0 - 8.5 g/dL   Albumin 4.3 3.8 - 4.9 g/dL   Globulin, Total 3.4 1.5 - 4.5 g/dL   Albumin/Globulin Ratio 1.3 1.2 - 2.2   Bilirubin Total 0.2 0.0 - 1.2 mg/dL   Alkaline Phosphatase 90 48 - 121 IU/L   AST 9 0 - 40 IU/L   ALT 10 0 - 32 IU/L  Lipid panel  Result Value Ref Range   Cholesterol, Total 179 100 - 199 mg/dL   Triglycerides 110 0 - 149 mg/dL   HDL 52 >39 mg/dL   VLDL Cholesterol Cal 20 5 - 40 mg/dL   LDL Chol Calc (NIH) 107 (H) 0 - 99 mg/dL   Chol/HDL Ratio 3.4 0.0 - 4.4 ratio  Hemoglobin A1c  Result Value Ref Range   Hgb A1c MFr Bld 7.1 (H) 4.8 - 5.6 %   Est. average glucose Bld gHb Est-mCnc 157 mg/dL  TSH  Result Value Ref Range   TSH 2.480 0.450 - 4.500 uIU/mL  Results for orders placed or performed in visit on 09/02/19  Urine Microscopic  Result Value Ref Range   WBC, UA 0-5 0 - 5 /hpf   RBC 3-10 (A) 0 - 2 /hpf   Epithelial Cells (non renal) 0-10 0 - 10 /hpf   Casts None seen None seen /lpf   Bacteria, UA None seen None seen/Few  POCT urinalysis dipstick  Result Value Ref Range   Color, UA yellow    Clarity, UA clear    Glucose, UA Negative Negative   Bilirubin, UA negative    Ketones, UA negative    Spec Grav, UA <=1.005 (A) 1.010 - 1.025   Blood, UA large    pH, UA 8.0 5.0 - 8.0   Protein, UA Negative Negative   Urobilinogen, UA 0.2 0.2 or 1.0 E.U./dL   Nitrite, UA negative    Leukocytes, UA Negative Negative   Appearance     Odor      Assessment & Plan      Encounter for medical examination to establish care  Screening for blood or protein in urine - Plan: POCT urinalysis dipstick  Body mass index (BMI) of  50-59.9 in adult (HCC)  Hypertension, unspecified type - Plan: CBC with Differential/Platelet, TSH, Comprehensive metabolic panel, amLODipine (NORVASC) 10 MG tablet, metoprolol succinate (TOPROL-XL) 50 MG 24 hr tablet, triamterene-hydrochlorothiazide (MAXZIDE-25) 37.5-25 MG tablet  Hyperlipidemia, unspecified hyperlipidemia type - Plan: Lipid panel, atorvastatin (LIPITOR) 10 MG tablet  History of hematuria  Encounter for screening mammogram for malignant neoplasm of breast - Plan: Ambulatory referral to Gynecology, MM Digital Screening  Screening for colon cancer - Plan: Ambulatory referral to Gastroenterology  Blood glucose elevated - Plan: HgB A1c  Hematuria, unspecified type - Plan: Urine Microscopic, Urine Culture She will go for labs today she is fasting.  Orders Placed This Encounter  Procedures  . Urine Culture  . MM Digital Screening  . CBC with Differential/Platelet  . Lipid panel  . TSH  . Comprehensive metabolic panel  . HgB A1c  . Urine Microscopic  . Ambulatory referral to Gynecology  . Ambulatory referral to Gastroenterology  . POCT urinalysis dipstick    Meds ordered this encounter  Medications  . amLODipine (NORVASC) 10 MG tablet  Sig: Take 1 tablet (10 mg total) by mouth daily.    Dispense:  90 tablet    Refill:  0  . atorvastatin (LIPITOR) 10 MG tablet    Sig: Take 1 tablet (10 mg total) by mouth daily.    Dispense:  90 tablet    Refill:  0  . metoprolol succinate (TOPROL-XL) 50 MG 24 hr tablet    Sig: Take 1 tablet (50 mg total) by mouth daily. Take with or immediately following a meal.    Dispense:  90 tablet    Refill:  0  . triamterene-hydrochlorothiazide (MAXZIDE-25) 37.5-25 MG tablet    Sig: Take 1 tablet by mouth daily.    Dispense:  90 tablet    Refill:  0  . fluticasone (FLONASE) 50 MCG/ACT nasal spray    Sig: Place 1 spray into both nostrils daily.    Dispense:  16 mL    Refill:  4  Medications were refilled as above no new  medications were added to patient's management plan at this time.  Blood pressure is slightly elevated today at 140/82.  BMI is 54.82.  Recommend dietary and lifestyle changes. Commend annual eye exams as well as biannual dental exams. Will check hemoglobin A1c with labs she is unsure what her previous was but reports she was started on Metformin and she was unable to take due to nausea and GI upset.  Has not been taking anything for diabetes in a while she is monitoring blood glucose at home.  She may need a new meter and supplies at next visit.  She has a meter at home currently that is old.  The patient is advised to begin progressive daily aerobic exercise program, attempt to lose weight, reduce salt in diet and cooking, reduce exposure to stress, continue current medications, continue current healthy lifestyle patterns and return for routine annual checkups.   Point-of-care test for urine is positive for blood will send for microscopic and discussed to patient that she may need a referral to urology again.  Patient verbalized understanding. Return in about 3 months (around 12/03/2019), or if symptoms worsen or fail to improve, for at any time for any worsening symptoms, Go to Emergency room/ urgent care if worse.     I[place] Andrick Rust, FNP, have reviewed all documentation for this visit. The documentation on 09/03/19 for the exam, diagnosis, procedures, and orders are all accurate and complete.    Marcille Buffy, [place] [phone] (phone) [phone] (fax)  [place]

## 2019-09-02 ENCOUNTER — Ambulatory Visit: Payer: 59 | Admitting: Adult Health

## 2019-09-02 ENCOUNTER — Other Ambulatory Visit: Payer: Self-pay | Admitting: Adult Health

## 2019-09-02 ENCOUNTER — Encounter: Payer: Self-pay | Admitting: Adult Health

## 2019-09-02 VITALS — BP 140/82 | HR 85 | Temp 96.8°F | Resp 16 | Ht 59.0 in | Wt 271.4 lb

## 2019-09-02 DIAGNOSIS — I152 Hypertension secondary to endocrine disorders: Secondary | ICD-10-CM | POA: Insufficient documentation

## 2019-09-02 DIAGNOSIS — Z1389 Encounter for screening for other disorder: Secondary | ICD-10-CM

## 2019-09-02 DIAGNOSIS — Z87448 Personal history of other diseases of urinary system: Secondary | ICD-10-CM

## 2019-09-02 DIAGNOSIS — Z1231 Encounter for screening mammogram for malignant neoplasm of breast: Secondary | ICD-10-CM

## 2019-09-02 DIAGNOSIS — Z6841 Body Mass Index (BMI) 40.0 and over, adult: Secondary | ICD-10-CM | POA: Diagnosis not present

## 2019-09-02 DIAGNOSIS — Z0000 Encounter for general adult medical examination without abnormal findings: Secondary | ICD-10-CM

## 2019-09-02 DIAGNOSIS — I10 Essential (primary) hypertension: Secondary | ICD-10-CM

## 2019-09-02 DIAGNOSIS — E785 Hyperlipidemia, unspecified: Secondary | ICD-10-CM

## 2019-09-02 DIAGNOSIS — R319 Hematuria, unspecified: Secondary | ICD-10-CM

## 2019-09-02 DIAGNOSIS — Z1211 Encounter for screening for malignant neoplasm of colon: Secondary | ICD-10-CM

## 2019-09-02 DIAGNOSIS — R739 Hyperglycemia, unspecified: Secondary | ICD-10-CM

## 2019-09-02 LAB — POCT URINALYSIS DIPSTICK
Bilirubin, UA: NEGATIVE
Glucose, UA: NEGATIVE
Ketones, UA: NEGATIVE
Leukocytes, UA: NEGATIVE
Nitrite, UA: NEGATIVE
Protein, UA: NEGATIVE
Spec Grav, UA: <=1.005 — AB (ref 1.010–1.025)
Urobilinogen, UA: 0.2 E.U./dL
pH, UA: 8 (ref 5.0–8.0)

## 2019-09-02 MED ORDER — TRIAMTERENE-HCTZ 37.5-25 MG PO TABS: 1.0000 | ORAL_TABLET | Freq: Every day | ORAL | 0 refills | Status: DC

## 2019-09-02 MED ORDER — METOPROLOL SUCCINATE ER 50 MG PO TB24: 50.0000 mg | ORAL_TABLET | Freq: Every day | ORAL | 0 refills | Status: DC

## 2019-09-02 MED ORDER — ATORVASTATIN CALCIUM 10 MG PO TABS: 10.0000 mg | ORAL_TABLET | Freq: Every day | ORAL | 0 refills | Status: DC

## 2019-09-02 MED ORDER — FLUTICASONE PROPIONATE 50 MCG/ACT NA SUSP: 1.0000 | Freq: Every day | NASAL | 4 refills | Status: DC

## 2019-09-02 MED ORDER — AMLODIPINE BESYLATE 10 MG PO TABS: 10.0000 mg | ORAL_TABLET | Freq: Every day | ORAL | 0 refills | Status: DC

## 2019-09-02 NOTE — Patient Instructions (Addendum)
Call to schedule your screening mammogram. Your orders have been placed for your exam.  Let our office know if you have questions, concerns, or any difficulty scheduling.  If normal results then yearly screening mammograms are recommended unless you notice  Changes in your breast then you should schedule a follow up office visit. If abnormal results  Further imaging will be warranted and sooner follow up as determined by the radiologist at the [facility].   [facility] at [facility] [place], [place] [address]  Main: [phone]    Health Maintenance, Female Adopting a healthy lifestyle and getting preventive care are important in promoting health and wellness. Ask your health care provider about:  The right schedule for you to have regular tests and exams.  Things you can do on your own to prevent diseases and keep yourself healthy. What should I know about diet, weight, and exercise? Eat a healthy diet   Eat a diet that includes plenty of vegetables, fruits, low-fat dairy products, and lean protein.  Do not eat a lot of foods that are high in solid fats, added sugars, or sodium. Maintain a healthy weight Body mass index (BMI) is used to identify weight problems. It estimates body fat based on height and weight. Your health care provider can help determine your BMI and help you achieve or maintain a healthy weight. Get regular exercise Get regular exercise. This is one of the most important things you can do for your health. Most adults should:  Exercise for at least 150 minutes each week. The exercise should increase your heart rate and make you sweat (moderate-intensity exercise).  Do strengthening exercises at least twice a week. This is in addition to the moderate-intensity exercise.  Spend less time sitting. Even light physical activity can be beneficial. Watch cholesterol and blood lipids Have your blood tested for lipids and  cholesterol at 61 years of age, then have this test every 5 years. Have your cholesterol levels checked more often if:  Your lipid or cholesterol levels are high.  You are older than 61 years of age.  You are at high risk for heart disease. What should I know about cancer screening? Depending on your health history and family history, you may need to have cancer screening at various ages. This may include screening for:  Breast cancer.  Cervical cancer.  Colorectal cancer.  Skin cancer.  Lung cancer. What should I know about heart disease, diabetes, and high blood pressure? Blood pressure and heart disease  High blood pressure causes heart disease and increases the risk of stroke. This is more likely to develop in people who have high blood pressure readings, are of African descent, or are overweight.  Have your blood pressure checked: ? Every 3-5 years if you are 39-56 years of age. ? Every year if you are 61 years old or older. Diabetes Have regular diabetes screenings. This checks your fasting blood sugar level. Have the screening done:  Once every three years after age 58 if you are at a normal weight and have a low risk for diabetes.  More often and at a younger age if you are overweight or have a high risk for diabetes. What should I know about preventing infection? Hepatitis B If you have a higher risk for hepatitis B, you should be screened for this virus. Talk with your health care provider to find out if you are at risk for hepatitis B infection. Hepatitis C Testing  is recommended for:  Everyone born from [birth year] through 1967.  Anyone with known risk factors for hepatitis C. Sexually transmitted infections (STIs)  Get screened for STIs, including gonorrhea and chlamydia, if: ? You are sexually active and are younger than 61 years of age. ? You are older than 61 years of age and your health care provider tells you that you are at risk for this type of  infection. ? Your sexual activity has changed since you were last screened, and you are at increased risk for chlamydia or gonorrhea. Ask your health care provider if you are at risk.  Ask your health care provider about whether you are at high risk for HIV. Your health care provider may recommend a prescription medicine to help prevent HIV infection. If you choose to take medicine to prevent HIV, you should first get tested for HIV. You should then be tested every 3 months for as long as you are taking the medicine. Pregnancy  If you are about to stop having your period (premenopausal) and you may become pregnant, seek counseling before you get pregnant.  Take 400 to 800 micrograms (mcg) of folic acid every day if you become pregnant.  Ask for birth control (contraception) if you want to prevent pregnancy. Osteoporosis and menopause Osteoporosis is a disease in which the bones lose minerals and strength with aging. This can result in bone fractures. If you are 32 years old or older, or if you are at risk for osteoporosis and fractures, ask your health care provider if you should:  Be screened for bone loss.  Take a calcium or vitamin D supplement to lower your risk of fractures.  Be given hormone replacement therapy (HRT) to treat symptoms of menopause. Follow these instructions at home: Lifestyle  Do not use any products that contain nicotine or tobacco, such as cigarettes, e-cigarettes, and chewing tobacco. If you need help quitting, ask your health care provider.  Do not use street drugs.  Do not share needles.  Ask your health care provider for help if you need support or information about quitting drugs. Alcohol use  Do not drink alcohol if: ? Your health care provider tells you not to drink. ? You are pregnant, may be pregnant, or are planning to become pregnant.  If you drink alcohol: ? Limit how much you use to 0-1 drink a day. ? Limit intake if you are  breastfeeding.  Be aware of how much alcohol is in your drink. In the [place] one drink equals one 12 oz bottle of beer (355 mL), one 5 oz glass of wine (148 mL), or one 1 oz glass of hard liquor (44 mL). General instructions  Schedule regular health, dental, and eye exams.  Stay current with your vaccines.  Tell your health care provider if: ? You often feel depressed. ? You have ever been abused or do not feel safe at home. Summary  Adopting a healthy lifestyle and getting preventive care are important in promoting health and wellness.  Follow your health care provider's instructions about healthy diet, exercising, and getting tested or screened for diseases.  Follow your health care provider's instructions on monitoring your cholesterol and blood pressure. This information is not intended to replace advice given to you by your health care provider. Make sure you discuss any questions you have with your health care provider. Document Revised: 01/16/2018 Document Reviewed: 01/16/2018 Elsevier Patient Education  2020 [place] for [employer] Calories are units of  energy. Your body needs a certain amount of calories from food to keep you going throughout the day. When you eat more calories than your body needs, your body stores the extra calories as fat. When you eat fewer calories than your body needs, your body burns fat to get the energy it needs. Calorie counting means keeping track of how many calories you eat and drink each day. Calorie counting can be helpful if you need to lose weight. If you make sure to eat fewer calories than your body needs, you should lose weight. Ask your health care provider what a healthy weight is for you. For calorie counting to work, you will need to eat the right number of calories in a day in order to lose a healthy amount of weight per week. A dietitian can help you determine how many calories you need in a day and will give you  suggestions on how to reach your calorie goal.  A healthy amount of weight to lose per week is usually 1-2 lb (0.5-0.9 kg). This usually means that your daily calorie intake should be reduced by 500-750 calories.  Eating 1,200 - 1,500 calories per day can help most women lose weight.  Eating 1,500 - 1,800 calories per day can help most men lose weight. What is my plan? My goal is to have __________ calories per day. If I have this many calories per day, I should lose around __________ pounds per week. What do I need to know about calorie counting? In order to meet your daily calorie goal, you will need to:  Find out how many calories are in each food you would like to eat. Try to do this before you eat.  Decide how much of the food you plan to eat.  Write down what you ate and how many calories it had. Doing this is called keeping a food log. To successfully lose weight, it is important to balance calorie counting with a healthy lifestyle that includes regular activity. Aim for 150 minutes of moderate exercise (such as walking) or 75 minutes of vigorous exercise (such as running) each week. Where do I find calorie information?  The number of calories in a food can be found on a Nutrition Facts label. If a food does not have a Nutrition Facts label, try to look up the calories online or ask your dietitian for help. Remember that calories are listed per serving. If you choose to have more than one serving of a food, you will have to multiply the calories per serving by the amount of servings you plan to eat. For example, the label on a package of bread might say that a serving size is 1 slice and that there are 90 calories in a serving. If you eat 1 slice, you will have eaten 90 calories. If you eat 2 slices, you will have eaten 180 calories. How do I keep a food log? Immediately after each meal, record the following information in your food log:  What you ate. Don't forget to include  toppings, sauces, and other extras on the food.  How much you ate. This can be measured in cups, ounces, or number of items.  How many calories each food and drink had.  The total number of calories in the meal. Keep your food log near you, such as in a small notebook in your pocket, or use a mobile app or website. Some programs will calculate calories for you and show you how many  calories you have left for the day to meet your goal. What are some calorie counting tips?   Use your calories on foods and drinks that will fill you up and not leave you hungry: ? Some examples of foods that fill you up are nuts and nut butters, vegetables, lean proteins, and high-fiber foods like whole grains. High-fiber foods are foods with more than 5 g fiber per serving. ? Drinks such as sodas, specialty coffee drinks, alcohol, and juices have a lot of calories, yet do not fill you up.  Eat nutritious foods and avoid empty calories. Empty calories are calories you get from foods or beverages that do not have many vitamins or protein, such as candy, sweets, and soda. It is better to have a nutritious high-calorie food (such as an avocado) than a food with few nutrients (such as a bag of chips).  Know how many calories are in the foods you eat most often. This will help you calculate calorie counts faster.  Pay attention to calories in drinks. Low-calorie drinks include water and unsweetened drinks.  Pay attention to nutrition labels for "low fat" or "fat free" foods. These foods sometimes have the same amount of calories or more calories than the full fat versions. They also often have added sugar, starch, or salt, to make up for flavor that was removed with the fat.  Find a way of tracking calories that works for you. Get creative. Try different apps or programs if writing down calories does not work for you. What are some portion control tips?  Know how many calories are in a serving. This will help you  know how many servings of a certain food you can have.  Use a measuring cup to measure serving sizes. You could also try weighing out portions on a kitchen scale. With time, you will be able to estimate serving sizes for some foods.  Take some time to put servings of different foods on your favorite plates, bowls, and cups so you know what a serving looks like.  Try not to eat straight from a bag or box. Doing this can lead to overeating. Put the amount you would like to eat in a cup or on a plate to make sure you are eating the right portion.  Use smaller plates, glasses, and bowls to prevent overeating.  Try not to multitask (for example, watch TV or use your computer) while eating. If it is time to eat, sit down at a table and enjoy your food. This will help you to know when you are full. It will also help you to be aware of what you are eating and how much you are eating. What are tips for following this plan? Reading food labels  Check the calorie count compared to the serving size. The serving size may be smaller than what you are used to eating.  Check the source of the calories. Make sure the food you are eating is high in vitamins and protein and low in saturated and trans fats. Shopping  Read nutrition labels while you shop. This will help you make healthy decisions before you decide to purchase your food.  Make a grocery list and stick to it. Cooking  Try to cook your favorite foods in a healthier way. For example, try baking instead of frying.  Use low-fat dairy products. Meal planning  Use more fruits and vegetables. Half of your plate should be fruits and vegetables.  Include lean proteins like poultry and  fish. How do I count calories when eating out?  Ask for smaller portion sizes.  Consider sharing an entree and sides instead of getting your own entree.  If you get your own entree, eat only half. Ask for a box at the beginning of your meal and put the rest of  your entree in it so you are not tempted to eat it.  If calories are listed on the menu, choose the lower calorie options.  Choose dishes that include vegetables, fruits, whole grains, low-fat dairy products, and lean protein.  Choose items that are boiled, broiled, grilled, or steamed. Stay away from items that are buttered, battered, fried, or served with cream sauce. Items labeled "crispy" are usually fried, unless stated otherwise.  Choose water, low-fat milk, unsweetened iced tea, or other drinks without added sugar. If you want an alcoholic beverage, choose a lower calorie option such as a glass of wine or light beer.  Ask for dressings, sauces, and syrups on the side. These are usually high in calories, so you should limit the amount you eat.  If you want a salad, choose a garden salad and ask for grilled meats. Avoid extra toppings like bacon, cheese, or fried items. Ask for the dressing on the side, or ask for olive oil and vinegar or lemon to use as dressing.  Estimate how many servings of a food you are given. For example, a serving of cooked rice is  cup or about the size of half a baseball. Knowing serving sizes will help you be aware of how much food you are eating at restaurants. The list below tells you how big or small some common portion sizes are based on everyday objects: ? 1 oz--4 stacked dice. ? 3 oz--1 deck of cards. ? 1 tsp--1 die. ? 1 Tbsp-- a ping-pong ball. ? 2 Tbsp--1 ping-pong ball. ?  cup-- baseball. ? 1 cup--1 baseball. Summary  Calorie counting means keeping track of how many calories you eat and drink each day. If you eat fewer calories than your body needs, you should lose weight.  A healthy amount of weight to lose per week is usually 1-2 lb (0.5-0.9 kg). This usually means reducing your daily calorie intake by 500-750 calories.  The number of calories in a food can be found on a Nutrition Facts label. If a food does not have a Nutrition Facts label,  try to look up the calories online or ask your dietitian for help.  Use your calories on foods and drinks that will fill you up, and not on foods and drinks that will leave you hungry.  Use smaller plates, glasses, and bowls to prevent overeating. This information is not intended to replace advice given to you by your health care provider. Make sure you discuss any questions you have with your health care provider. Document Revised: 10/12/2017 Document Reviewed: 12/24/2015 Elsevier Patient Education  [place] and Cholesterol Restricted Eating Plan Getting too much fat and cholesterol in your diet may cause health problems. Choosing the right foods helps keep your fat and cholesterol at normal levels. This can keep you from getting certain diseases. Your doctor may recommend an eating plan that includes:  Total fat: ______% or less of total calories a day.  Saturated fat: ______% or less of total calories a day.  Cholesterol: less than _________mg a day.  Fiber: ______g a day. What are tips for following this plan? Meal planning  At meals, divide your plate  into four equal parts: ? Fill one-half of your plate with vegetables and green salads. ? Fill one-fourth of your plate with whole grains. ? Fill one-fourth of your plate with low-fat (lean) protein foods.  Eat fish that is high in omega-3 fats at least two times a week. This includes mackerel, tuna, sardines, and salmon.  Eat foods that are high in fiber, such as whole grains, beans, apples, broccoli, carrots, peas, and barley. General tips   Work with your doctor to lose weight if you need to.  Avoid: ? Foods with added sugar. ? Fried foods. ? Foods with partially hydrogenated oils.  Limit alcohol intake to no more than 1 drink a day for nonpregnant women and 2 drinks a day for men. One drink equals 12 oz of beer, 5 oz of wine, or 1 oz of hard liquor. Reading food labels  Check food labels  for: ? Trans fats. ? Partially hydrogenated oils. ? Saturated fat (g) in each serving. ? Cholesterol (mg) in each serving. ? Fiber (g) in each serving.  Choose foods with healthy fats, such as: ? Monounsaturated fats. ? Polyunsaturated fats. ? Omega-3 fats.  Choose grain products that have whole grains. Look for the word "whole" as the first word in the ingredient list. Cooking  Cook foods using low-fat methods. These include baking, boiling, grilling, and broiling.  Eat more home-cooked foods. Eat at restaurants and buffets less often.  Avoid cooking using saturated fats, such as butter, cream, palm oil, palm kernel oil, and coconut oil. Recommended foods  Fruits  All fresh, canned (in natural juice), or frozen fruits. Vegetables  Fresh or frozen vegetables (raw, steamed, roasted, or grilled). Green salads. Grains  Whole grains, such as whole wheat or whole grain breads, crackers, cereals, and pasta. Unsweetened oatmeal, bulgur, barley, quinoa, or brown rice. Corn or whole wheat flour tortillas. Meats and other protein foods  Ground beef (85% or leaner), grass-fed beef, or beef trimmed of fat. Skinless chicken or [place]. Ground chicken or [place]. Pork trimmed of fat. All fish and seafood. Egg whites. Dried beans, peas, or lentils. Unsalted nuts or seeds. Unsalted canned beans. Nut butters without added sugar or oil. Dairy  Low-fat or nonfat dairy products, such as skim or 1% milk, 2% or reduced-fat cheeses, low-fat and fat-free ricotta or cottage cheese, or plain low-fat and nonfat yogurt. Fats and oils  Tub margarine without trans fats. Light or reduced-fat mayonnaise and salad dressings. Avocado. Olive, canola, sesame, or safflower oils. The items listed above may not be a complete list of foods and beverages you can eat. Contact a dietitian for more information. Foods to avoid Fruits  Canned fruit in heavy syrup. Fruit in cream or butter sauce. Fried  fruit. Vegetables  Vegetables cooked in cheese, cream, or butter sauce. Fried vegetables. Grains  White bread. White pasta. White rice. Cornbread. Bagels, pastries, and croissants. Crackers and snack foods that contain trans fat and hydrogenated oils. Meats and other protein foods  Fatty cuts of meat. Ribs, chicken wings, bacon, sausage, bologna, salami, chitterlings, fatback, hot dogs, bratwurst, and packaged lunch meats. Liver and organ meats. Whole eggs and egg yolks. Chicken and [place] with skin. Fried meat. Dairy  Whole or 2% milk, cream, half-and-half, and cream cheese. Whole milk cheeses. Whole-fat or sweetened yogurt. Full-fat cheeses. Nondairy creamers and whipped toppings. Processed cheese, cheese spreads, and cheese curds. Beverages  Alcohol. Sugar-sweetened drinks such as sodas, lemonade, and fruit drinks. Fats and oils  Butter, stick margarine, lard,  shortening, ghee, or bacon fat. Coconut, palm kernel, and palm oils. Sweets and desserts  Corn syrup, sugars, honey, and molasses. Candy. Jam and jelly. Syrup. Sweetened cereals. Cookies, pies, cakes, donuts, muffins, and ice cream. The items listed above may not be a complete list of foods and beverages you should avoid. Contact a dietitian for more information. Summary  Choosing the right foods helps keep your fat and cholesterol at normal levels. This can keep you from getting certain diseases.  At meals, fill one-half of your plate with vegetables and green salads.  Eat high-fiber foods, like whole grains, beans, apples, carrots, peas, and barley.  Limit added sugar, saturated fats, alcohol, and fried foods. This information is not intended to replace advice given to you by your health care provider. Make sure you discuss any questions you have with your health care provider. Document Revised: 09/26/2017 Document Reviewed: 10/10/2016 Elsevier Patient Education  [place].

## 2019-09-02 NOTE — Progress Notes (Signed)
Blood on POCT sent for microscopic.

## 2019-09-03 ENCOUNTER — Other Ambulatory Visit: Payer: Self-pay | Admitting: Adult Health

## 2019-09-03 DIAGNOSIS — R3129 Other microscopic hematuria: Secondary | ICD-10-CM | POA: Insufficient documentation

## 2019-09-03 LAB — CBC WITH DIFFERENTIAL/PLATELET
Basophils Absolute: 0.1 10*3/uL (ref 0.0–0.2)
Basos: 1 %
EOS (ABSOLUTE): 0.1 10*3/uL (ref 0.0–0.4)
Eos: 1 %
Hematocrit: 42.1 % (ref 34.0–46.6)
Hemoglobin: 13.6 g/dL (ref 11.1–15.9)
Immature Grans (Abs): 0 10*3/uL (ref 0.0–0.1)
Immature Granulocytes: 0 %
Lymphocytes Absolute: 0.9 10*3/uL (ref 0.7–3.1)
Lymphs: 11 %
MCH: 26.7 pg (ref 26.6–33.0)
MCHC: 32.3 g/dL (ref 31.5–35.7)
MCV: 83 fL (ref 79–97)
Monocytes Absolute: 0.4 10*3/uL (ref 0.1–0.9)
Monocytes: 5 %
Neutrophils Absolute: 7.3 10*3/uL — ABNORMAL HIGH (ref 1.4–7.0)
Neutrophils: 82 %
Platelets: 377 10*3/uL (ref 150–450)
RBC: 5.09 x10E6/uL (ref 3.77–5.28)
RDW: 15.8 % — ABNORMAL HIGH (ref 11.7–15.4)
WBC: 8.9 10*3/uL (ref 3.4–10.8)

## 2019-09-03 LAB — HEMOGLOBIN A1C
Est. average glucose Bld gHb Est-mCnc: 157 mg/dL
Hgb A1c MFr Bld: 7.1 % — ABNORMAL HIGH (ref 4.8–5.6)

## 2019-09-03 LAB — LIPID PANEL
Chol/HDL Ratio: 3.4 ratio (ref 0.0–4.4)
Cholesterol, Total: 179 mg/dL (ref 100–199)
HDL: 52 mg/dL (ref >39)
LDL Chol Calc (NIH): 107 mg/dL — ABNORMAL HIGH (ref 0–99)
Triglycerides: 110 mg/dL (ref 0–149)
VLDL Cholesterol Cal: 20 mg/dL (ref 5–40)

## 2019-09-03 LAB — URINALYSIS, MICROSCOPIC ONLY
Bacteria, UA: NONE SEEN
Casts: NONE SEEN /lpf

## 2019-09-03 LAB — COMPREHENSIVE METABOLIC PANEL
ALT: 10 IU/L (ref 0–32)
AST: 9 IU/L (ref 0–40)
Albumin/Globulin Ratio: 1.3 (ref 1.2–2.2)
Albumin: 4.3 g/dL (ref 3.8–4.9)
Alkaline Phosphatase: 90 IU/L (ref 48–121)
BUN/Creatinine Ratio: 10 — ABNORMAL LOW (ref 12–28)
BUN: 9 mg/dL (ref 8–27)
Bilirubin Total: 0.2 mg/dL (ref 0.0–1.2)
CO2: 28 mmol/L (ref 20–29)
Calcium: 9.8 mg/dL (ref 8.7–10.3)
Chloride: 99 mmol/L (ref 96–106)
Creatinine, Ser: 0.89 mg/dL (ref 0.57–1.00)
GFR calc Af Amer: 81 mL/min/{1.73_m2} (ref >59)
GFR calc non Af Amer: 71 mL/min/{1.73_m2} (ref >59)
Globulin, Total: 3.4 g/dL (ref 1.5–4.5)
Glucose: 131 mg/dL — ABNORMAL HIGH (ref 65–99)
Potassium: 4.1 mmol/L (ref 3.5–5.2)
Sodium: 141 mmol/L (ref 134–144)
Total Protein: 7.7 g/dL (ref 6.0–8.5)

## 2019-09-03 LAB — TSH: TSH: 2.48 u[IU]/mL (ref 0.450–4.500)

## 2019-09-03 NOTE — Progress Notes (Signed)
CBC okay within normal deviation range with mild irregularities no signs of anemia or infection. CMP with elevated glucose at 131.  Hemoglobin A1c is also elevated in diabetic range at 7.1.  She was unable to tolerate Metformin that was previously recently started with her previous primary care provider due to nausea and GI upset. Extended release would be a option for Metformin to see if that works better for her.  Or she can try diet and increased exercise for the next 3 months. Recheck hemoglobin A1C - nutritionist referral  if she desires. Other medication options schedule follow up. Will need meter to check fasting glucose Q am if she does not have.  TSH normal.   Kidney function is within normal limits.  Electrolytes within normal limits.  LDL cholesterol is mildly elevated at 107.Total cholesterol and LDL elevated.  Discuss lifestyle modification with patient e.g. increase exercise, fiber, fruits, vegetables, lean meat, and omega 3/fish intake and decrease saturated fat.

## 2019-09-03 NOTE — Progress Notes (Signed)
Hematuria was seen on the microscopic urine.  I have referred her to urology Hollice Espy as we discussed in the office visit.  I do recommend her follow back up with urology for further work-up.  Given her history of chronic hematuria.

## 2019-09-03 NOTE — Progress Notes (Signed)
Orders Placed This Encounter  Procedures  . Ambulatory referral to Urology    Referral Priority:   Routine    Referral Type:   Consultation    Referral Reason:   Specialty Services Required    Referred to Provider:   Hollice Espy, MD    Requested Specialty:   Urology    Number of Visits Requested:   1    Hematuria, microscopic - Plan: Ambulatory referral to Urology

## 2019-09-04 LAB — URINE CULTURE

## 2019-09-04 NOTE — Progress Notes (Signed)
Normal routine flora, no need for treatment unless symptomatic.

## 2019-09-22 NOTE — Progress Notes (Signed)
09/23/2019 10:51 AM   Geraldo Pitter Johnnye Sima 10-14-1960 [id number]  Referring provider: Doreen Beam, [address] [place],  [place] [address] Chief Complaint  Patient presents with  . Hematuria    HPI: Lynn Hughes is a 59 y.o. female who presents today for evaluation and management of microscopic hematuria.   The patient had an initial consult with Laverna Peace, FNP on 09/02/2019. She notes a history of hematuria and saw urology and cystocopy and she was told everything was normal. She reported that was years ago. UA dipstick was negative. Microscopic UA showed 3-10 RBCs. Urine culture was negative.   History of fibroids. She has uterus and and ovaries. Last PAP smear 2 years ago normal. Wants gynecology.  She is a former social smoker; quit on 09/04/1986.   The patient is currently menstruating. Reports chronic urgency and frequency. Denies hematuria.   She is drinking plenty fluids. She feels like water increases symptoms. Denies constipation.   She reports having a normal cystoscopy x 3+ years ago in [place]. Patient does not want another cysto due to a previous bad experience.   PMH: Past Medical History:  Diagnosis Date  . Allergy   . Anemia   . Anxiety   . Arthritis   . Diabetes mellitus without complication ([place])   . Fibroids   . Heart murmur   . Hypertension     Surgical History: Past Surgical History:  Procedure Laterality Date  . BOWEL RESECTION N/A 08/22/2018   Procedure: SMALL BOWEL RESECTION;  Surgeon: Jules Husbands, MD;  Location: [facility] ORS;  Service: General;  Laterality: N/A;  . VENTRAL HERNIA REPAIR N/A 08/22/2018   Procedure: HERNIA REPAIR VENTRAL ADULT;  Surgeon: Jules Husbands, MD;  Location: [facility] ORS;  Service: General;  Laterality: N/A;    Home Medications:  Allergies as of 09/23/2019      Reactions   Ace Inhibitors Swelling   Swelling of face and lips   Codeine Nausea And Vomiting      Medication List        Accurate as of September 23, 2019 10:51 AM. If you have any questions, ask your nurse or doctor.        amLODipine 10 MG tablet Commonly known as: NORVASC Take 1 tablet (10 mg total) by mouth daily.   atorvastatin 10 MG tablet Commonly known as: LIPITOR Take 1 tablet (10 mg total) by mouth daily.   fluticasone 50 MCG/ACT nasal spray Commonly known as: FLONASE Place 1 spray into both nostrils daily.   metoprolol succinate 50 MG 24 hr tablet Commonly known as: TOPROL-XL Take 1 tablet (50 mg total) by mouth daily. Take with or immediately following a meal.   triamterene-hydrochlorothiazide 37.5-25 MG tablet Commonly known as: MAXZIDE-25 Take 1 tablet by mouth daily.       Allergies:  Allergies  Allergen Reactions  . Ace Inhibitors Swelling    Swelling of face and lips  . Codeine Nausea And Vomiting    Family History: Family History  Problem Relation Age of Onset  . Cancer Mother   . Prostate cancer Father   . Cancer Father   . Cancer Maternal Aunt   . Cancer Paternal Aunt   . Heart attack Paternal Uncle   . Cirrhosis Maternal Grandmother     Social History:  reports that she quit smoking about 33 years ago. She has never used smokeless tobacco. She reports that she does not use drugs. No history on file for alcohol  use.   Physical Exam: BP (!) 165/92   Pulse 94   Ht 4\' 11"  (1.499 m)   Wt 268 lb (121.6 kg)   BMI 54.13 kg/m   Constitutional:  Alert and oriented, No acute distress.  Obese. HEENT: [place] AT, moist mucus membranes.  Trachea midline, no masses. Cardiovascular: No clubbing, cyanosis, or edema. Respiratory: Normal respiratory effort, no increased work of breathing. Skin: No rashes, bruises or suspicious lesions. Neurologic: Grossly intact, no focal deficits, moving all 4 extremities. Psychiatric: Normal mood and affect.  Laboratory Data:  Lab Results  Component Value Date   CREATININE 0.89 09/02/2019    Lab Results  Component Value Date    HGBA1C 7.1 (H) 09/02/2019    Urinalysis Greater than 30 RBCs, 5-10 WBCs, no bacteria, nitrate negative.   Assessment & Plan:    1. Microscopic hematuria  Personal history of microscopic hematuria status post evaluation 7 years ago in [place].  Records for this were requested today.  We discussed the microscopic hematuria evaluation including the differential diagnosis and indications for this.  She is a remote history of infrequent smoking, was a social smoker and thus does not increase her risk factor.  She has a larger volume of blood today but is menstruating.  She had a small amount of blood, 3-10 thus she is fairly low risk and having had a fairly recent hematuria evaluation, this is also somewhat reassuring.  Despite this, have recommended pursuing at minimum repeat cystoscopy and renal ultrasound.  She did have upper tract imaging last year in the form of CT abdomen pelvis which was personally reviewed which showed essentially normal kidneys bilaterally without delayed upper tract imaging.  She did have some very small lesions in the kidneys which were statistically benign however also supports follow-up with renal ultrasound.,  UA today consistent with menstruation, will send culture to rule out infection although not suspected  We discussed my own personal experience with in office cystoscopy which is very well-tolerated.  If she has a lot of anxiety around the procedure, we could consider premedication with Valium but she did have a driver.  She is still hesitant to pursue this.  We also discussed having this done in the operating room but is concerned about anesthesia and cost.  Ultimately, she understands the rationale for the study and will let us know if she elects to pursue this.  She understands the risk of missed diagnosis and absence of this procedure.  Patient will call back with decisions on pursuing cystoscopy.   Patient agreed to RUS.   2. OAB/Urgency/Frequency    Likely multifactorial given large fibroids, obesity and diabetes. Discussed behavioral modifications. Patient is not willing to change her habits. Patient may consider pharmaceutical route in the future.     [facility] [address] [place], [place] [address] [phone]  I, Earnest Batten, am acting as a scribe for Dr. Hollice Espy.  I have reviewed the above documentation for accuracy and completeness, and I agree with the above.   Hollice Espy, MD

## 2019-09-23 ENCOUNTER — Encounter: Payer: Self-pay | Admitting: Urology

## 2019-09-23 ENCOUNTER — Other Ambulatory Visit: Payer: Self-pay

## 2019-09-23 ENCOUNTER — Ambulatory Visit: Payer: 59 | Admitting: Urology

## 2019-09-23 VITALS — BP 165/92 | HR 94 | Ht 59.0 in | Wt 268.0 lb

## 2019-09-23 DIAGNOSIS — R3129 Other microscopic hematuria: Secondary | ICD-10-CM

## 2019-09-23 LAB — URINALYSIS, COMPLETE
Bilirubin, UA: NEGATIVE
Glucose, UA: NEGATIVE
Ketones, UA: NEGATIVE
Leukocytes,UA: NEGATIVE
Nitrite, UA: NEGATIVE
Specific Gravity, UA: 1.025 (ref 1.005–1.030)
Urobilinogen, Ur: 0.2 mg/dL (ref 0.2–1.0)
pH, UA: 6 (ref 5.0–7.5)

## 2019-09-23 LAB — MICROSCOPIC EXAMINATION: RBC, Urine: >30 /hpf — AB (ref 0–2)

## 2019-09-25 LAB — CULTURE, URINE COMPREHENSIVE

## 2019-10-09 ENCOUNTER — Ambulatory Visit: Payer: 59 | Admitting: Obstetrics and Gynecology

## 2019-10-14 ENCOUNTER — Other Ambulatory Visit: Payer: Self-pay | Admitting: Urology

## 2019-10-22 ENCOUNTER — Ambulatory Visit: Payer: 59 | Admitting: Obstetrics and Gynecology

## 2019-10-27 ENCOUNTER — Ambulatory Visit (INDEPENDENT_AMBULATORY_CARE_PROVIDER_SITE_OTHER): Payer: 59 | Admitting: Obstetrics and Gynecology

## 2019-10-27 ENCOUNTER — Other Ambulatory Visit (HOSPITAL_COMMUNITY)
Admission: RE | Admit: 2019-10-27 | Discharge: 2019-10-27 | Disposition: A | Discharge status: Home / Self Care | Payer: 59 | Source: Ambulatory Visit | Attending: Obstetrics and Gynecology | Admitting: Obstetrics and Gynecology

## 2019-10-27 ENCOUNTER — Encounter: Payer: Self-pay | Admitting: Obstetrics and Gynecology

## 2019-10-27 ENCOUNTER — Other Ambulatory Visit: Payer: Self-pay

## 2019-10-27 VITALS — BP 160/90 | Ht 59.0 in | Wt 265.0 lb

## 2019-10-27 DIAGNOSIS — Z01419 Encounter for gynecological examination (general) (routine) without abnormal findings: Secondary | ICD-10-CM

## 2019-10-27 DIAGNOSIS — N939 Abnormal uterine and vaginal bleeding, unspecified: Secondary | ICD-10-CM

## 2019-10-27 DIAGNOSIS — Z124 Encounter for screening for malignant neoplasm of cervix: Secondary | ICD-10-CM

## 2019-10-27 DIAGNOSIS — Z1231 Encounter for screening mammogram for malignant neoplasm of breast: Secondary | ICD-10-CM | POA: Diagnosis not present

## 2019-10-27 DIAGNOSIS — Z1211 Encounter for screening for malignant neoplasm of colon: Secondary | ICD-10-CM

## 2019-10-27 DIAGNOSIS — Z780 Asymptomatic menopausal state: Secondary | ICD-10-CM

## 2019-10-27 DIAGNOSIS — Z1151 Encounter for screening for human papillomavirus (HPV): Secondary | ICD-10-CM | POA: Insufficient documentation

## 2019-10-27 DIAGNOSIS — D219 Benign neoplasm of connective and other soft tissue, unspecified: Secondary | ICD-10-CM

## 2019-10-27 DIAGNOSIS — Z8049 Family history of malignant neoplasm of other genital organs: Secondary | ICD-10-CM

## 2019-10-27 NOTE — Progress Notes (Signed)
PCP: Doreen Beam, FNP   Chief Complaint  Patient presents with  . Gynecologic Exam    HPI:      Ms. Lynn Hughes is a 59 y.o. No obstetric history on file. whose LMP was No LMP recorded. Patient is postmenopausal., presents today for her NP annual examination, referred by PCP.  Her menses are monthly, lasting 5-7 days, mod flow with long clots, no BTB usually, mild dysmen. Has been having bleeding every few days this cycle, unusual for pt. She has never stopped having periods and has never missed one. Hx of large leio, last eval by GYN a couple yrs ago with GYN u/s. Pt states she was told by GYN that she may always have bleeding, so hasn't thought  much about it. Has occas vasomotor sx. Mom and sisters had hyst.   Sex activity: not sexually active. She does not have vaginal dryness.  Last Pap: a few yrs ago;  Results were: no abnormalities per pt report. No hx of abn pap needing treatment.  Last mammogram: not recent; Ordered by PCP recently. There is no FH of breast cancer. There is no FH of ovarian cancer. There is a FH uterine cancer in her mom, mat aunt and 1 sister, genetic testing not done. The patient does do self-breast exams.  Colonoscopy: never; has ref from PCP; appt not sched yet  Tobacco use: The patient denies current or previous tobacco use. Alcohol use: social drinker  No drug use Exercise: not active  She does get adequate calcium or Vitamin D in her diet.  Labs with PCP.   Past Medical History:  Diagnosis Date  . Allergy   . Anemia   . Anxiety   . Arthritis   . Diabetes mellitus without complication ([place])   . Fibroids   . Heart murmur   . Hypertension     Past Surgical History:  Procedure Laterality Date  . BOWEL RESECTION N/A 08/22/2018   Procedure: SMALL BOWEL RESECTION;  Surgeon: Jules Husbands, MD;  Location: [facility] OR[facility];  Service: General;  Laterality: N/A;  . VENTRAL HERNIA REPAIR N/A 08/22/2018   Procedure: HERNIA REPAIR VENTRAL  ADULT;  Surgeon: Jules Husbands, MD;  Location: [facility];  Service: General;  Laterality: N/A;    Family History  Problem Relation Age of Onset  . Uterine cancer Mother 45  . Prostate cancer Father   . Uterine cancer Maternal Aunt 40  . Cancer Paternal Aunt        not sure  . Heart attack Paternal Uncle        x2 uncles  . Cirrhosis Maternal Grandmother   . Uterine cancer Sister 38    Social History   Socioeconomic History  . Marital status: Single    Spouse name: Not on file  . Number of children: Not on file  . Years of education: Not on file  . Highest education level: Not on file  Occupational History  . Not on file  Tobacco Use  . Smoking status: Former Smoker    Quit date: 09/04/1986    Years since quitting: 33.1  . Smokeless tobacco: Never Used  Vaping Use  . Vaping Use: Never used  Substance and Sexual Activity  . Alcohol use: Not on file    Comment: occassionally  . Drug use: Never  . Sexual activity: Not Currently    Birth control/protection: None  Other Topics Concern  . Not on file  Social History Narrative  . Not  on file   Social Determinants of Health   Financial Resource Strain:   . Difficulty of Paying Living Expenses: Not on file  Food Insecurity:   . Worried About Charity fundraiser in the Last Year: Not on file  . Ran Out of Food in the Last Year: Not on file  Transportation Needs:   . Lack of Transportation (Medical): Not on file  . Lack of Transportation (Non-Medical): Not on file  Physical Activity:   . Days of Exercise per Week: Not on file  . Minutes of Exercise per Session: Not on file  Stress:   . Feeling of Stress : Not on file  Social Connections:   . Frequency of Communication with Friends and Family: Not on file  . Frequency of Social Gatherings with Friends and Family: Not on file  . Attends Religious Services: Not on file  . Active Member of Clubs or Organizations: Not on file  . Attends Archivist Meetings: Not  on file  . Marital Status: Not on file  Intimate Partner Violence:   . Fear of Current or Ex-Partner: Not on file  . Emotionally Abused: Not on file  . Physically Abused: Not on file  . Sexually Abused: Not on file     Current Outpatient Medications:  .  amLODipine (NORVASC) 10 MG tablet, Take 1 tablet (10 mg total) by mouth daily., Disp: 90 tablet, Rfl: 0 .  atorvastatin (LIPITOR) 10 MG tablet, Take 1 tablet (10 mg total) by mouth daily., Disp: 90 tablet, Rfl: 0 .  fluticasone (FLONASE) 50 MCG/ACT nasal spray, Place 1 spray into both nostrils daily., Disp: 16 mL, Rfl: 4 .  metoprolol succinate (TOPROL-XL) 50 MG 24 hr tablet, Take 1 tablet (50 mg total) by mouth daily. Take with or immediately following a meal., Disp: 90 tablet, Rfl: 0 .  triamterene-hydrochlorothiazide (MAXZIDE-25) 37.5-25 MG tablet, Take 1 tablet by mouth daily., Disp: 90 tablet, Rfl: 0     ROS:  Review of Systems  Constitutional: Negative for fatigue, fever and unexpected weight change.  Respiratory: Negative for cough, shortness of breath and wheezing.   Cardiovascular: Negative for chest pain, palpitations and leg swelling.  Gastrointestinal: Negative for blood in stool, constipation, diarrhea, nausea and vomiting.  Endocrine: Negative for cold intolerance, heat intolerance and polyuria.  Genitourinary: Positive for vaginal bleeding. Negative for dyspareunia, dysuria, flank pain, frequency, genital sores, hematuria, menstrual problem, pelvic pain, urgency, vaginal discharge and vaginal pain.  Musculoskeletal: Negative for back pain, joint swelling and myalgias.  Skin: Negative for rash.  Neurological: Negative for dizziness, syncope, light-headedness, numbness and headaches.  Hematological: Negative for adenopathy.  Psychiatric/Behavioral: Negative for agitation, confusion, sleep disturbance and suicidal ideas. The patient is not nervous/anxious.   BREAST: No symptoms   Objective: BP (!) 160/90   Ht 4\' 11"   (1.499 m)   Wt 265 lb (120.2 kg)   BMI 53.52 kg/m    Physical Exam Constitutional:      Appearance: She is well-developed.  Genitourinary:     Vulva, vagina, cervix, right adnexa and left adnexa normal.     No vulval lesion or tenderness noted.     No vaginal discharge, erythema or tenderness.     No cervical polyp.     Uterus is enlarged and irregular.     Uterus is not tender.     No right or left adnexal mass present.     Right adnexa not tender.     Left adnexa not  tender.  Neck:     Thyroid: No thyromegaly.  Cardiovascular:     Rate and Rhythm: Normal rate and regular rhythm.     Heart sounds: Normal heart sounds. No murmur heard.   Pulmonary:     Effort: Pulmonary effort is normal.     Breath sounds: Normal breath sounds.  Chest:     Breasts:        Right: No mass, nipple discharge, skin change or tenderness.        Left: No mass, nipple discharge, skin change or tenderness.  Abdominal:     Palpations: Abdomen is rigid. There is mass.     Tenderness: There is no abdominal tenderness. There is no guarding.    Musculoskeletal:        General: Normal range of motion.     Cervical back: Normal range of motion.  Neurological:     General: No focal deficit present.     Mental Status: She is alert and oriented to person, place, and time.     Cranial Nerves: No cranial nerve deficit.  Skin:    General: Skin is warm and dry.  Psychiatric:        Mood and Affect: Mood normal.        Behavior: Behavior normal.        Thought Content: Thought content normal.        Judgment: Judgment normal.  Vitals reviewed.     Assessment/Plan:  Encounter for annual routine gynecological examination  Cervical cancer screening - Plan: Cytology - PAP  Screening for HPV (human papillomavirus) - Plan: Cytology - PAP  Encounter for screening mammogram for malignant neoplasm of breast; pt to sched mammo  Family history of uterine cancer--MyRisk testing discussed and pt declines  today.  Screening for colon cancer--phone # for [facility] given to pt to call to sched appt (they have been UTR pt).  Abnormal uterine bleeding (AUB) - Plan: US PELVIS TRANSVAGINAL NON-OB (TV ONLY); large leio. Check labs and GYN u/s. Will f/u with results.  Leiomyoma - Plan: US PELVIS TRANSVAGINAL NON-OB (TV ONLY); discussed hyst but given size, will be a challenge. Also discussed [place] vs lupron to shrink leio. Will f/u after u/s results.  Menopause - Plan: FSH/LH, Estradiol; check labs to determine PMB vs normal menstrual bleeding. FH uterine cancer.           GYN counsel mammography screening, menopause, adequate intake of calcium and vitamin D, diet and exercise    F/U  Return in about 1 day (around 10/28/2019) for GYN u/s for AUB/leio--[facility] to call pt.  Payson Evrard B. Chianne Byrns, PA-C 10/27/2019 2:35 PM

## 2019-10-27 NOTE — Patient Instructions (Signed)
I value your feedback and entrusting us with your care. If you get a [facility] patient survey, I would appreciate you taking the time to let us know about your experience today. Thank you!  As of January 16, 2019, your lab results will be released to your MyChart immediately, before I even have a chance to see them. Please give me time to review them and contact you if there are any abnormalities. Thank you for your patience.   [facility]: [phone]  [facility]: [phone]  

## 2019-10-28 ENCOUNTER — Ambulatory Visit: Payer: 59

## 2019-10-28 LAB — FSH/LH
FSH: 27.8 m[IU]/mL
LH: 19.4 m[IU]/mL

## 2019-10-28 LAB — ESTRADIOL: Estradiol: 9.4 pg/mL

## 2019-10-29 ENCOUNTER — Telehealth: Payer: Self-pay | Admitting: Obstetrics and Gynecology

## 2019-10-29 ENCOUNTER — Other Ambulatory Visit: Payer: Self-pay | Admitting: Obstetrics and Gynecology

## 2019-10-29 ENCOUNTER — Ambulatory Visit (INDEPENDENT_AMBULATORY_CARE_PROVIDER_SITE_OTHER): Payer: 59

## 2019-10-29 ENCOUNTER — Other Ambulatory Visit: Payer: Self-pay

## 2019-10-29 DIAGNOSIS — D219 Benign neoplasm of connective and other soft tissue, unspecified: Secondary | ICD-10-CM

## 2019-10-29 DIAGNOSIS — N939 Abnormal uterine and vaginal bleeding, unspecified: Secondary | ICD-10-CM

## 2019-10-29 LAB — CYTOLOGY - PAP
Comment: NEGATIVE
Diagnosis: NEGATIVE
High risk HPV: NEGATIVE

## 2019-10-29 NOTE — Telephone Encounter (Signed)
Pt aware of GYN u/s results. Pt still with monthly menses at age 59. Having AUB this cycle. Discussed [facility] with overnight stay, but can't schedule currently due to Covid. Could possibly due 11/21 or 12/21. Also discussed [place] which may prevent need for hyst. Pt also needs EMB. Suggested she RTO with Dr. Kenton Kingfisher (discussed case with him) for EMB, TAH conf. Pt to consider options and f/u with decision. Bleeding is slowing down.  ULTRASOUND REPORT  Location: [facility]  Date of Service: 10/29/2019    Indications:Fibroids   Findings:  The uterus is retroverted and measures 28.8 x 16.3 x 25.9 cm. Echo texture is heterogenous with evidence of focal masses. Within the uterus are multiple suspected fibroids measuring: Fibroid 1: 137.7 x 133.9 x 164.9 mm  intramural Fibroid 2: several 4 cm or smaller, some calcified.   The Endometrium is not visualized.   Right Ovary is questionably visualized.  The left ovary is not visualized.  Survey of the adnexa demonstrates no adnexal masses. There is no free fluid in the cul de sac.  Impression: 1. The uterus is enlarged within multiple fibroids, to numerous to count.  2. The largest fibroid is 16.5 cm.  3. The other fibroids visualized are 4 cm or smaller, some calcified.  4. The endometrium is not visible due to the fibroids.  3. The right ovary is questionably visualized.  4. The left ovary is not visible.   Recommendations: 1.Clinical correlation with the patient's History and Physical Exam.   Lynn Hughes, RT

## 2019-10-30 NOTE — Telephone Encounter (Signed)
Completed.

## 2019-12-02 NOTE — Progress Notes (Signed)
Established patient visit   Patient: Lynn Hughes   DOB: 10-Mar-1960   59 y.o. Female  MRN: [id number] Visit Date: 12/03/2019  Today's healthcare provider: Marcille Buffy, FNP   Chief Complaint  Patient presents with  . Hypertension   Subjective    HPI  Hypertension, follow-up  BP Readings from Last 3 Encounters:  12/03/19 136/70  10/27/19 (!) 160/90  09/23/19 (!) 165/92   Wt Readings from Last 3 Encounters:  12/03/19 270 lb 12.8 oz (122.8 kg)  10/27/19 265 lb (120.2 kg)  09/23/19 268 lb (121.6 kg)     She was last seen for hypertension 3 months ago. She has missed a couple of doses of her medication. She did join a gym and has not gone yet plans.. She is limiting salt in diet.  BP at that visit was 140/82. Management since that visit includes labs were ordered patient to continue on current medication she had been previously on from another provider for " years".   She reports excellent compliance with treatment. She is not having side effects. She is following a Regular diet.She is not exercising. Patient states that she has recently joined gym She does not smoke.  Use of agents associated with hypertension: none.   Outside blood pressures are not being checked. Symptoms: No chest pain No chest pressure  No palpitations No syncope  No dyspnea No orthopnea  No paroxysmal nocturnal dyspnea No lower extremity edema   Hemoglobin A1C was 7.1 last check, she was previously on Metformin and reports made her very nauseated, she stopped it and preferred to try diet and exercise last visit and will have rechecked today.She currently declines diabetes medication.    Of note she did follow up with urology, and was offered cystoscopy and is thinking on this. She is to have follow up renal US for small lesions seen on the CT abdomen.   She also seen [facility] gynecology and needs endometrial biopsy. She has family history of endometrial cancer and is urged  to go ahead and schedule. She had abnormal vaginal bleeding that has since stopped. Still urged to call.   She is reminded to call gastroenterology they have been trying to reach her to schedule colonoscopy.   Patient  denies any fever, body aches,chills, rash, chest pain, shortness of breath, nausea, vomiting, or diarrhea.  Denies dizziness, lightheadedness, pre syncopal or syncopal episodes.   Pertinent labs: Lab Results  Component Value Date   CHOL 179 09/02/2019   HDL 52 09/02/2019   LDLCALC 107 (H) 09/02/2019   TRIG 110 09/02/2019   CHOLHDL 3.4 09/02/2019   Lab Results  Component Value Date   NA 141 09/02/2019   K 4.1 09/02/2019   CREATININE 0.89 09/02/2019   GFRNONAA 71 09/02/2019   GFRAA 81 09/02/2019   GLUCOSE 131 (H) 09/02/2019     The 10-year ASCVD risk score Mikey Bussing DC Jr., et al., 2013) is: 18%   --------------------------------------------------------------------------------------------------- Patient Active Problem List   Diagnosis Date Noted  . Abnormal vaginal bleeding 12/03/2019  . Leiomyoma 10/27/2019  . Family history of uterine cancer 10/27/2019  . Hematuria, microscopic 09/03/2019  . History of hematuria 09/02/2019  . Hyperlipidemia 09/02/2019  . Hypertension 09/02/2019  . BMI 50.0-59.9, adult ([place]) 09/02/2019  . Encounter for screening mammogram for malignant neoplasm of breast 09/02/2019  . S/P repair of ventral hernia 08/22/2018   Past Medical History:  Diagnosis Date  . Allergy   . Anemia   .  Anxiety   . Arthritis   . Diabetes mellitus without complication ([facility])   . Fibroids   . Heart murmur   . Hypertension    Allergies  Allergen Reactions  . Ace Inhibitors Swelling    Swelling of face and lips  . Codeine Nausea And Vomiting     Medications: Outpatient Medications Prior to Visit  Medication Sig  . fluticasone (FLONASE) 50 MCG/ACT nasal spray Place 1 spray into both nostrils daily.  . [DISCONTINUED] amLODipine (NORVASC) 10 MG  tablet Take 1 tablet (10 mg total) by mouth daily.  . [DISCONTINUED] atorvastatin (LIPITOR) 10 MG tablet Take 1 tablet (10 mg total) by mouth daily.  . [DISCONTINUED] metoprolol succinate (TOPROL-XL) 50 MG 24 hr tablet Take 1 tablet (50 mg total) by mouth daily. Take with or immediately following a meal.  . [DISCONTINUED] triamterene-hydrochlorothiazide (MAXZIDE-25) 37.5-25 MG tablet Take 1 tablet by mouth daily.   No facility-administered medications prior to visit.    Review of Systems  Constitutional: Negative.   Respiratory: Negative.   Cardiovascular: Negative.   Gastrointestinal: Negative.   Genitourinary: Positive for vaginal bleeding. Negative for decreased urine volume, difficulty urinating, dyspareunia, dysuria, enuresis, flank pain, frequency, genital sores, hematuria, menstrual problem, pelvic pain, urgency, vaginal discharge and vaginal pain.    Last CBC Lab Results  Component Value Date   WBC 8.9 09/02/2019   HGB 13.6 09/02/2019   HCT 42.1 09/02/2019   MCV 83 09/02/2019   MCH 26.7 09/02/2019   RDW 15.8 (H) 09/02/2019   PLT 377 17/40/8144   Last metabolic panel Lab Results  Component Value Date   GLUCOSE 131 (H) 09/02/2019   NA 141 09/02/2019   K 4.1 09/02/2019   CL 99 09/02/2019   CO2 28 09/02/2019   BUN 9 09/02/2019   CREATININE 0.89 09/02/2019   GFRNONAA 71 09/02/2019   GFRAA 81 09/02/2019   CALCIUM 9.8 09/02/2019   PHOS 4.8 (H) 08/22/2018   PROT 7.7 09/02/2019   ALBUMIN 4.3 09/02/2019   LABGLOB 3.4 09/02/2019   AGRATIO 1.3 09/02/2019   BILITOT 0.2 09/02/2019   ALKPHOS 90 09/02/2019   AST 9 09/02/2019   ALT 10 09/02/2019   ANIONGAP 6 08/24/2018   Last lipids Lab Results  Component Value Date   CHOL 179 09/02/2019   HDL 52 09/02/2019   LDLCALC 107 (H) 09/02/2019   TRIG 110 09/02/2019   CHOLHDL 3.4 09/02/2019   Last hemoglobin A1c Lab Results  Component Value Date   HGBA1C 7.1 (H) 09/02/2019      Objective    BP 136/70   Pulse 89    Temp 98.4 F (36.9 C) (Oral)   Resp 16   Wt 270 lb 12.8 oz (122.8 kg)   SpO2 93%   BMI 54.69 kg/m  BP Readings from Last 3 Encounters:  12/03/19 136/70  10/27/19 (!) 160/90  09/23/19 (!) 165/92     Filed Weights   12/03/19 0810  Weight: 270 lb 12.8 oz (122.8 kg)     Physical Exam Constitutional:      General: She is not in acute distress.    Appearance: She is obese. She is not ill-appearing, toxic-appearing or diaphoretic.  HENT:     Head: Normocephalic and atraumatic.     Right Ear: External ear normal.     Left Ear: External ear normal.     Nose: Nose normal.     Mouth/Throat:     Mouth: Mucous membranes are moist.  Eyes:  Pupils: Pupils are equal, round, and reactive to light.  Cardiovascular:     Rate and Rhythm: Normal rate and regular rhythm.     Pulses: Normal pulses.     Heart sounds: Normal heart sounds. No murmur heard.  No friction rub. No gallop.   Pulmonary:     Effort: Pulmonary effort is normal. No respiratory distress.     Breath sounds: Normal breath sounds. No stridor. No wheezing, rhonchi or rales.  Chest:     Chest wall: No tenderness.  Abdominal:     General: There is no distension.     Palpations: Abdomen is soft.     Tenderness: There is no abdominal tenderness. There is no right CVA tenderness or left CVA tenderness.  Musculoskeletal:        General: No tenderness. Normal range of motion.     Cervical back: Normal range of motion and neck supple.     Right lower leg: No edema.     Left lower leg: No edema.  Lymphadenopathy:     Cervical: No cervical adenopathy.  Skin:    General: Skin is warm.     Findings: No erythema or rash.  Neurological:     General: No focal deficit present.     Mental Status: She is alert and oriented to person, place, and time.     Motor: No weakness.     Gait: Gait normal.  Psychiatric:        Mood and Affect: Mood normal.        Behavior: Behavior normal.        Thought Content: Thought content  normal.        Judgment: Judgment normal.       No results found for any visits on 12/03/19.  Assessment & Plan      Hypertension associated with diabetes ([facility]) - Plan: HgB A1c, CBC with Differential/Platelet, Comprehensive Metabolic Panel (CMET), amLODipine (NORVASC) 10 MG tablet, metoprolol succinate (TOPROL-XL) 50 MG 24 hr tablet, triamterene-hydrochlorothiazide (MAXZIDE-25) 37.5-25 MG tablet, Lipid Panel w/o Chol/HDL Ratio, Urine Microalbumin w/creat. ratio  Hyperlipidemia, unspecified hyperlipidemia type - Plan: atorvastatin (LIPITOR) 10 MG tablet, Lipid Panel w/o Chol/HDL Ratio  Abnormal vaginal bleeding  Leiomyoma  BMI 50.0-59.9, adult ([facility])  Meds ordered this encounter  Medications  . amLODipine (NORVASC) 10 MG tablet    Sig: Take 1 tablet (10 mg total) by mouth daily.    Dispense:  90 tablet    Refill:  2  . atorvastatin (LIPITOR) 10 MG tablet    Sig: Take 1 tablet (10 mg total) by mouth daily.    Dispense:  90 tablet    Refill:  2  . metoprolol succinate (TOPROL-XL) 50 MG 24 hr tablet    Sig: Take 1 tablet (50 mg total) by mouth daily. Take with or immediately following a meal.    Dispense:  90 tablet    Refill:  2  . triamterene-hydrochlorothiazide (MAXZIDE-25) 37.5-25 MG tablet    Sig: Take 1 tablet by mouth daily.    Dispense:  90 tablet    Refill:  2   Discussed Diabetes,Hypertension, lifestyle and dietary changes. She declined diabetes medication at this time discussed risks versus benefits.  Weight loss and exercise discussed.  Continue hypertensive medications, keep log at home.   Discussed importance of follow up with OBGYN Dr. Kenton Kingfisher for surgical consult  For Total hysterectomy versus biopsy. She was referred to [facility] PA-C who had initial consult, she had abnormal vaginal bleeding  at new patient visit. Patient is aware to call [facility] for a follow up as soon as possible , she denies any worsening symptoms, however provider stressed to her the  importance given her maternal family history of endometrial cancer. .   Discussed importance of mammogram which has  been previously ordered as well as colonoscopy.  Also discussed Cologard as option. She reports she will call [facility] to schedule and call GI back as they did try to reach her from referral however were unable to do so,     Return in about 3 months (around 03/04/2020), or if symptoms worsen or fail to improve, for at any time for any worsening symptoms, Go to Emergency room/ urgent care if worse.     Addressed acute and or chronic medical problems today requiring 45 minutes reviewing patients medical record,labs, counseling patient regarding patient's conditions, any medications, answering questions regarding health, and coordination of care as needed. After visit summary patient given copy and reviewed.     Marcille Buffy, [place] [phone] (phone) [phone] (fax)  [place]

## 2019-12-03 ENCOUNTER — Encounter: Payer: Self-pay | Admitting: Adult Health

## 2019-12-03 ENCOUNTER — Other Ambulatory Visit: Payer: Self-pay

## 2019-12-03 ENCOUNTER — Ambulatory Visit: Payer: 59 | Admitting: Adult Health

## 2019-12-03 ENCOUNTER — Other Ambulatory Visit: Payer: Self-pay | Admitting: Adult Health

## 2019-12-03 VITALS — BP 136/70 | HR 89 | Temp 98.4°F | Resp 16 | Wt 270.8 lb

## 2019-12-03 DIAGNOSIS — E785 Hyperlipidemia, unspecified: Secondary | ICD-10-CM

## 2019-12-03 DIAGNOSIS — D219 Benign neoplasm of connective and other soft tissue, unspecified: Secondary | ICD-10-CM

## 2019-12-03 DIAGNOSIS — I152 Hypertension secondary to endocrine disorders: Secondary | ICD-10-CM

## 2019-12-03 DIAGNOSIS — N939 Abnormal uterine and vaginal bleeding, unspecified: Secondary | ICD-10-CM | POA: Diagnosis not present

## 2019-12-03 DIAGNOSIS — E1159 Type 2 diabetes mellitus with other circulatory complications: Secondary | ICD-10-CM | POA: Diagnosis not present

## 2019-12-03 DIAGNOSIS — Z6841 Body Mass Index (BMI) 40.0 and over, adult: Secondary | ICD-10-CM

## 2019-12-03 MED ORDER — AMLODIPINE BESYLATE 10 MG PO TABS: 10.0000 mg | ORAL_TABLET | Freq: Every day | ORAL | 2 refills | Status: DC

## 2019-12-03 MED ORDER — METOPROLOL SUCCINATE ER 50 MG PO TB24: 50.0000 mg | ORAL_TABLET | Freq: Every day | ORAL | 2 refills | Status: DC

## 2019-12-03 MED ORDER — TRIAMTERENE-HCTZ 37.5-25 MG PO TABS: 1.0000 | ORAL_TABLET | Freq: Every day | ORAL | 2 refills | Status: DC

## 2019-12-03 MED ORDER — ATORVASTATIN CALCIUM 10 MG PO TABS: 10.0000 mg | ORAL_TABLET | Freq: Every day | ORAL | 2 refills | Status: DC

## 2019-12-03 NOTE — Patient Instructions (Addendum)
Call to schedule your screening mammogram. Your orders have been placed for your exam.  Let our office know if you have questions, concerns, or any difficulty scheduling.  If normal results then yearly screening mammograms are recommended unless you notice  Changes in your breast then you should schedule a follow up office visit. If abnormal results  Further imaging will be warranted and sooner follow up as determined by the radiologist at the [facility].   [facility] at [facility] [place], [place] [address]  Main: [phone]         Hypertension, Adult Hypertension is another name for high blood pressure. High blood pressure forces your heart to work harder to pump blood. This can cause problems over time. There are two numbers in a blood pressure reading. There is a top number (systolic) over a bottom number (diastolic). It is best to have a blood pressure that is below 120/80. Healthy choices can help lower your blood pressure, or you may need medicine to help lower it. What are the causes? The cause of this condition is not known. Some conditions may be related to high blood pressure. What increases the risk?  Smoking.  Having type 2 diabetes mellitus, high cholesterol, or both.  Not getting enough exercise or physical activity.  Being overweight.  Having too much fat, sugar, calories, or salt (sodium) in your diet.  Drinking too much alcohol.  Having long-term (chronic) kidney disease.  Having a family history of high blood pressure.  Age. Risk increases with age.  Race. You may be at higher risk if you are African American.  Gender. Men are at higher risk than women before age 39. After age 90, women are at higher risk than men.  Having obstructive sleep apnea.  Stress. What are the signs or symptoms?  High blood pressure may not cause symptoms. Very high blood pressure (hypertensive crisis) may  cause: ? Headache. ? Feelings of worry or nervousness (anxiety). ? Shortness of breath. ? Nosebleed. ? A feeling of being sick to your stomach (nausea). ? Throwing up (vomiting). ? Changes in how you see. ? Very bad chest pain. ? Seizures. How is this treated?  This condition is treated by making healthy lifestyle changes, such as: ? Eating healthy foods. ? Exercising more. ? Drinking less alcohol.  Your health care provider may prescribe medicine if lifestyle changes are not enough to get your blood pressure under control, and if: ? Your top number is above 130. ? Your bottom number is above 80.  Your personal target blood pressure may vary. Follow these instructions at home: Eating and drinking   If told, follow the DASH eating plan. To follow this plan: ? Fill one half of your plate at each meal with fruits and vegetables. ? Fill one fourth of your plate at each meal with whole grains. Whole grains include whole-wheat pasta, brown rice, and whole-grain bread. ? Eat or drink low-fat dairy products, such as skim milk or low-fat yogurt. ? Fill one fourth of your plate at each meal with low-fat (lean) proteins. Low-fat proteins include fish, chicken without skin, eggs, beans, and tofu. ? Avoid fatty meat, cured and processed meat, or chicken with skin. ? Avoid pre-made or processed food.  Eat less than 1,500 mg of salt each day.  Do not drink alcohol if: ? Your doctor tells you not to drink. ? You are pregnant, may be pregnant, or are planning to become pregnant.  If you drink alcohol: ? Limit how much you use to:  0-1 drink a day for women.  0-2 drinks a day for men. ? Be aware of how much alcohol is in your drink. In the [place] one drink equals one 12 oz bottle of beer (355 mL), one 5 oz glass of wine (148 mL), or one 1 oz glass of hard liquor (44 mL). Lifestyle   Work with your doctor to stay at a healthy weight or to lose weight. Ask your doctor what the best  weight is for you.  Get at least 30 minutes of exercise most days of the week. This may include walking, swimming, or biking.  Get at least 30 minutes of exercise that strengthens your muscles (resistance exercise) at least 3 days a week. This may include lifting weights or doing Pilates.  Do not use any products that contain nicotine or tobacco, such as cigarettes, e-cigarettes, and chewing tobacco. If you need help quitting, ask your doctor.  Check your blood pressure at home as told by your doctor.  Keep all follow-up visits as told by your doctor. This is important. Medicines  Take over-the-counter and prescription medicines only as told by your doctor. Follow directions carefully.  Do not skip doses of blood pressure medicine. The medicine does not work as well if you skip doses. Skipping doses also puts you at risk for problems.  Ask your doctor about side effects or reactions to medicines that you should watch for. Contact a doctor if you:  Think you are having a reaction to the medicine you are taking.  Have headaches that keep coming back (recurring).  Feel dizzy.  Have swelling in your ankles.  Have trouble with your vision. Get help right away if you:  Get a very bad headache.  Start to feel mixed up (confused).  Feel weak or numb.  Feel faint.  Have very bad pain in your: ? Chest. ? Belly (abdomen).  Throw up more than once.  Have trouble breathing. Summary  Hypertension is another name for high blood pressure.  High blood pressure forces your heart to work harder to pump blood.  For most people, a normal blood pressure is less than 120/80.  Making healthy choices can help lower blood pressure. If your blood pressure does not get lower with healthy choices, you may need to take medicine. This information is not intended to replace advice given to you by your health care provider. Make sure you discuss any questions you have with your health care  provider. Document Revised: 10/03/2017 Document Reviewed: 10/03/2017 Elsevier Patient Education  [place] DASH stands for "Dietary Approaches to Stop Hypertension." The DASH eating plan is a healthy eating plan that has been shown to reduce high blood pressure (hypertension). It may also reduce your risk for type 2 diabetes, heart disease, and stroke. The DASH eating plan may also help with weight loss. What are tips for following this plan?  General guidelines  Avoid eating more than 2,300 mg (milligrams) of salt (sodium) a day. If you have hypertension, you may need to reduce your sodium intake to 1,500 mg a day.  Limit alcohol intake to no more than 1 drink a day for nonpregnant women and 2 drinks a day for men. One drink equals 12 oz of beer, 5 oz of wine, or 1 oz of hard liquor.  Work with your health care provider to maintain a healthy body weight or to lose  weight. Ask what an ideal weight is for you.  Get at least 30 minutes of exercise that causes your heart to beat faster (aerobic exercise) most days of the week. Activities may include walking, swimming, or biking.  Work with your health care provider or diet and nutrition specialist (dietitian) to adjust your eating plan to your individual calorie needs. Reading food labels   Check food labels for the amount of sodium per serving. Choose foods with less than 5 percent of the Daily Value of sodium. Generally, foods with less than 300 mg of sodium per serving fit into this eating plan.  To find whole grains, look for the word "whole" as the first word in the ingredient list. Shopping  Buy products labeled as "low-sodium" or "no salt added."  Buy fresh foods. Avoid canned foods and premade or frozen meals. Cooking  Avoid adding salt when cooking. Use salt-free seasonings or herbs instead of table salt or sea salt. Check with your health care provider or pharmacist before using salt  substitutes.  Do not fry foods. Cook foods using healthy methods such as baking, boiling, grilling, and broiling instead.  Cook with heart-healthy oils, such as olive, canola, soybean, or sunflower oil. Meal planning  Eat a balanced diet that includes: ? 5 or more servings of fruits and vegetables each day. At each meal, try to fill half of your plate with fruits and vegetables. ? Up to 6-8 servings of whole grains each day. ? Less than 6 oz of lean meat, poultry, or fish each day. A 3-oz serving of meat is about the same size as a deck of cards. One egg equals 1 oz. ? 2 servings of low-fat dairy each day. ? A serving of nuts, seeds, or beans 5 times each week. ? Heart-healthy fats. Healthy fats called Omega-3 fatty acids are found in foods such as flaxseeds and coldwater fish, like sardines, salmon, and mackerel.  Limit how much you eat of the following: ? Canned or prepackaged foods. ? Food that is high in trans fat, such as fried foods. ? Food that is high in saturated fat, such as fatty meat. ? Sweets, desserts, sugary drinks, and other foods with added sugar. ? Full-fat dairy products.  Do not salt foods before eating.  Try to eat at least 2 vegetarian meals each week.  Eat more home-cooked food and less restaurant, buffet, and fast food.  When eating at a restaurant, ask that your food be prepared with less salt or no salt, if possible. What foods are recommended? The items listed may not be a complete list. Talk with your dietitian about what dietary choices are best for you. Grains Whole-grain or whole-wheat bread. Whole-grain or whole-wheat pasta. Brown rice. [place]. Bulgur. Whole-grain and low-sodium cereals. Pita bread. Low-fat, low-sodium crackers. Whole-wheat flour tortillas. Vegetables Fresh or frozen vegetables (raw, steamed, roasted, or grilled). Low-sodium or reduced-sodium tomato and vegetable juice. Low-sodium or reduced-sodium tomato sauce and tomato  paste. Low-sodium or reduced-sodium canned vegetables. Fruits All fresh, dried, or frozen fruit. Canned fruit in natural juice (without added sugar). Meat and other protein foods Skinless chicken or [place]. Ground chicken or [place]. Pork with fat trimmed off. Fish and seafood. Egg whites. Dried beans, peas, or lentils. Unsalted nuts, nut butters, and seeds. Unsalted canned beans. Lean cuts of beef with fat trimmed off. Low-sodium, lean deli meat. Dairy Low-fat (1%) or fat-free (skim) milk. Fat-free, low-fat, or reduced-fat cheeses. Nonfat, low-sodium ricotta or cottage cheese. Low-fat or nonfat  yogurt. Low-fat, low-sodium cheese. Fats and oils Soft margarine without trans fats. Vegetable oil. Low-fat, reduced-fat, or light mayonnaise and salad dressings (reduced-sodium). Canola, safflower, olive, soybean, and sunflower oils. Avocado. Seasoning and other foods Herbs. Spices. Seasoning mixes without salt. Unsalted popcorn and pretzels. Fat-free sweets. What foods are not recommended? The items listed may not be a complete list. Talk with your dietitian about what dietary choices are best for you. Grains Baked goods made with fat, such as croissants, muffins, or some breads. Dry pasta or rice meal packs. Vegetables Creamed or fried vegetables. Vegetables in a cheese sauce. Regular canned vegetables (not low-sodium or reduced-sodium). Regular canned tomato sauce and paste (not low-sodium or reduced-sodium). Regular tomato and vegetable juice (not low-sodium or reduced-sodium). Angie Fava. Olives. Fruits Canned fruit in a light or heavy syrup. Fried fruit. Fruit in cream or butter sauce. Meat and other protein foods Fatty cuts of meat. Ribs. Fried meat. Berniece Salines. Sausage. Bologna and other processed lunch meats. Salami. Fatback. Hotdogs. Bratwurst. Salted nuts and seeds. Canned beans with added salt. Canned or smoked fish. Whole eggs or egg yolks. Chicken or [place] with skin. Dairy Whole or 2% milk,  cream, and half-and-half. Whole or full-fat cream cheese. Whole-fat or sweetened yogurt. Full-fat cheese. Nondairy creamers. Whipped toppings. Processed cheese and cheese spreads. Fats and oils Butter. Stick margarine. Lard. Shortening. Ghee. Bacon fat. Tropical oils, such as coconut, palm kernel, or palm oil. Seasoning and other foods Salted popcorn and pretzels. Onion salt, garlic salt, seasoned salt, table salt, and sea salt. Worcestershire sauce. Tartar sauce. Barbecue sauce. Teriyaki sauce. Soy sauce, including reduced-sodium. Steak sauce. Canned and packaged gravies. Fish sauce. Oyster sauce. Cocktail sauce. Horseradish that you find on the shelf. Ketchup. Mustard. Meat flavorings and tenderizers. Bouillon cubes. Hot sauce and Tabasco sauce. Premade or packaged marinades. Premade or packaged taco seasonings. Relishes. Regular salad dressings. Where to find more information:  National Heart, Lung, and [place]: https://wilson-eaton.com/  American Heart Association: www.heart.org Summary  The DASH eating plan is a healthy eating plan that has been shown to reduce high blood pressure (hypertension). It may also reduce your risk for type 2 diabetes, heart disease, and stroke.  With the DASH eating plan, you should limit salt (sodium) intake to 2,300 mg a day. If you have hypertension, you may need to reduce your sodium intake to 1,500 mg a day.  When on the DASH eating plan, aim to eat more fresh fruits and vegetables, whole grains, lean proteins, low-fat dairy, and heart-healthy fats.  Work with your health care provider or diet and nutrition specialist (dietitian) to adjust your eating plan to your individual calorie needs. This information is not intended to replace advice given to you by your health care provider. Make sure you discuss any questions you have with your health care provider. Document Revised: 01/05/2017 Document Reviewed: 01/17/2016 Elsevier Patient Education  2020 [place]. Diabetes Basics  Diabetes (diabetes mellitus) is a long-term (chronic) disease. It occurs when the body does not properly use sugar (glucose) that is released from food after you eat. Diabetes may be caused by one or both of these problems:  Your pancreas does not make enough of a hormone called insulin.  Your body does not react in a normal way to insulin that it makes. Insulin lets sugars (glucose) go into cells in your body. This gives you energy. If you have diabetes, sugars cannot get into cells. This causes high blood sugar (hyperglycemia). Follow these instructions at home: How  is diabetes treated? You may need to take insulin or other diabetes medicines daily to keep your blood sugar in balance. Take your diabetes medicines every day as told by your doctor. List your diabetes medicines here: Diabetes medicines  Name of medicine: ______________________________ ? Amount (dose): _______________ Time (a.m./p.m.): _______________ Notes: ___________________________________  Name of medicine: ______________________________ ? Amount (dose): _______________ Time (a.m./p.m.): _______________ Notes: ___________________________________  Name of medicine: ______________________________ ? Amount (dose): _______________ Time (a.m./p.m.): _______________ Notes: ___________________________________ If you use insulin, you will learn how to give yourself insulin by injection. You may need to adjust the amount based on the food that you eat. List the types of insulin you use here: Insulin  Insulin type: ______________________________ ? Amount (dose): _______________ Time (a.m./p.m.): _______________ Notes: ___________________________________  Insulin type: ______________________________ ? Amount (dose): _______________ Time (a.m./p.m.): _______________ Notes: ___________________________________  Insulin type: ______________________________ ? Amount (dose): _______________ Time (a.m./p.m.):  _______________ Notes: ___________________________________  Insulin type: ______________________________ ? Amount (dose): _______________ Time (a.m./p.m.): _______________ Notes: ___________________________________  Insulin type: ______________________________ ? Amount (dose): _______________ Time (a.m./p.m.): _______________ Notes: ___________________________________ How do I manage my blood sugar?  Check your blood sugar levels using a blood glucose monitor as directed by your doctor. Your doctor will set treatment goals for you. Generally, you should have these blood sugar levels:  Before meals (preprandial): 80-130 mg/dL (4.4-7.2 mmol/L).  After meals (postprandial): below 180 mg/dL (10 mmol/L).  A1c level: less than 7%. Write down the times that you will check your blood sugar levels: Blood sugar checks  Time: _______________ Notes: ___________________________________  Time: _______________ Notes: ___________________________________  Time: _______________ Notes: ___________________________________  Time: _______________ Notes: ___________________________________  Time: _______________ Notes: ___________________________________  Time: _______________ Notes: ___________________________________  What do I need to know about low blood sugar? Low blood sugar is called hypoglycemia. This is when blood sugar is at or below 70 mg/dL (3.9 mmol/L). Symptoms may include:  Feeling: ? Hungry. ? Worried or nervous (anxious). ? Sweaty and clammy. ? Confused. ? Dizzy. ? Sleepy. ? Sick to your stomach (nauseous).  Having: ? A fast heartbeat. ? A headache. ? A change in your vision. ? Tingling or no feeling (numbness) around the mouth, lips, or tongue. ? Jerky movements that you cannot control (seizure).  Having trouble with: ? Moving (coordination). ? Sleeping. ? Passing out (fainting). ? Getting upset easily (irritability). Treating low blood sugar To treat low blood  sugar, eat or drink something sugary right away. If you can think clearly and swallow safely, follow the 15:15 rule:  Take 15 grams of a fast-acting carb (carbohydrate). Talk with your doctor about how much you should take.  Some fast-acting carbs are: ? Sugar tablets (glucose pills). Take 3-4 glucose pills. ? 6-8 pieces of hard candy. ? 4-6 oz (120-150 mL) of fruit juice. ? 4-6 oz (120-150 mL) of regular (not diet) soda. ? 1 Tbsp (15 mL) honey or sugar.  Check your blood sugar 15 minutes after you take the carb.  If your blood sugar is still at or below 70 mg/dL (3.9 mmol/L), take 15 grams of a carb again.  If your blood sugar does not go above 70 mg/dL (3.9 mmol/L) after 3 tries, get help right away.  After your blood sugar goes back to normal, eat a meal or a snack within 1 hour. Treating very low blood sugar If your blood sugar is at or below 54 mg/dL (3 mmol/L), you have very low blood sugar (severe hypoglycemia). This is an emergency. Do not wait to  see if the symptoms will go away. Get medical help right away. Call your local emergency services (911 in the [place] Do not drive yourself to the hospital. Questions to ask your health care provider  Do I need to meet with a diabetes educator?  What equipment will I need to care for myself at home?  What diabetes medicines do I need? When should I take them?  How often do I need to check my blood sugar?  What number can I call if I have questions?  When is my next doctor's visit?  Where can I find a support group for people with diabetes? Where to find more information  American Diabetes Association: www.diabetes.org  American Association of Diabetes Educators: www.diabeteseducator.org/patient-resources Contact a doctor if:  Your blood sugar is at or above 240 mg/dL (13.3 mmol/L) for 2 days in a row.  You have been sick or have had a fever for 2 days or more, and you are not getting better.  You have any of these  problems for more than 6 hours: ? You cannot eat or drink. ? You feel sick to your stomach (nauseous). ? You throw up (vomit). ? You have watery poop (diarrhea). Get help right away if:  Your blood sugar is lower than 54 mg/dL (3 mmol/L).  You get confused.  You have trouble: ? Thinking clearly. ? Breathing. Summary  Diabetes (diabetes mellitus) is a long-term (chronic) disease. It occurs when the body does not properly use sugar (glucose) that is released from food after digestion.  Take insulin and diabetes medicines as told.  Check your blood sugar every day, as often as told.  Keep all follow-up visits as told by your doctor. This is important. This information is not intended to replace advice given to you by your health care provider. Make sure you discuss any questions you have with your health care provider. Document Revised: 10/16/2018 Document Reviewed: 04/27/2017 Elsevier Patient Education  2020 [place] refers to food and lifestyle choices that are based on the traditions of countries located on the [employer]. This way of eating has been shown to help prevent certain conditions and improve outcomes for people who have chronic diseases, like kidney disease and heart disease. What are tips for following this plan? Lifestyle  Cook and eat meals together with your family, when possible.  Drink enough fluid to keep your urine clear or pale yellow.  Be physically active every day. This includes: ? Aerobic exercise like running or swimming. ? Leisure activities like gardening, walking, or housework.  Get 7-8 hours of sleep each night.  If recommended by your health care provider, drink red wine in moderation. This means 1 glass a day for nonpregnant women and 2 glasses a day for men. A glass of wine equals 5 oz (150 mL). Reading food labels   Check the serving size of packaged foods. For foods such as rice and  pasta, the serving size refers to the amount of cooked product, not dry.  Check the total fat in packaged foods. Avoid foods that have saturated fat or trans fats.  Check the ingredients list for added sugars, such as corn syrup. Shopping  At the grocery store, buy most of your food from the areas near the walls of the store. This includes: ? Fresh fruits and vegetables (produce). ? Grains, beans, nuts, and seeds. Some of these may be available in unpackaged forms or large amounts (in bulk). ?  Fresh seafood. ? Poultry and eggs. ? Low-fat dairy products.  Buy whole ingredients instead of prepackaged foods.  Buy fresh fruits and vegetables in-season from local farmers markets.  Buy frozen fruits and vegetables in resealable bags.  If you do not have access to quality fresh seafood, buy precooked frozen shrimp or canned fish, such as tuna, salmon, or sardines.  Buy small amounts of raw or cooked vegetables, salads, or olives from the deli or salad bar at your store.  Stock your pantry so you always have certain foods on hand, such as olive oil, canned tuna, canned tomatoes, rice, pasta, and beans. Cooking  Cook foods with extra-virgin olive oil instead of using butter or other vegetable oils.  Have meat as a side dish, and have vegetables or grains as your main dish. This means having meat in small portions or adding small amounts of meat to foods like pasta or stew.  Use beans or vegetables instead of meat in common dishes like chili or lasagna.  Experiment with different cooking methods. Try roasting or broiling vegetables instead of steaming or sauteing them.  Add frozen vegetables to soups, stews, pasta, or rice.  Add nuts or seeds for added healthy fat at each meal. You can add these to yogurt, salads, or vegetable dishes.  Marinate fish or vegetables using olive oil, lemon juice, garlic, and fresh herbs. Meal planning   Plan to eat 1 vegetarian meal one day each week.  Try to work up to 2 vegetarian meals, if possible.  Eat seafood 2 or more times a week.  Have healthy snacks readily available, such as: ? Vegetable sticks with hummus. ? [place] yogurt. ? Fruit and nut trail mix.  Eat balanced meals throughout the week. This includes: ? Fruit: 2-3 servings a day ? Vegetables: 4-5 servings a day ? Low-fat dairy: 2 servings a day ? Fish, poultry, or lean meat: 1 serving a day ? Beans and legumes: 2 or more servings a week ? Nuts and seeds: 1-2 servings a day ? Whole grains: 6-8 servings a day ? Extra-virgin olive oil: 3-4 servings a day  Limit red meat and sweets to only a few servings a month What are my food choices?  Mediterranean diet ? Recommended  Grains: Whole-grain pasta. Brown rice. [place] wheat. Polenta. Couscous. Whole-wheat bread. [place].  Vegetables: Artichokes. Beets. Broccoli. Cabbage. Carrots. Eggplant. Green beans. Chard. Kale. Spinach. Onions. Leeks. Peas. Squash. Tomatoes. Peppers. Radishes.  Fruits: Apples. Apricots. Avocado. Berries. Bananas. Cherries. Dates. Figs. Grapes. Lemons. Melon. Oranges. Peaches. Plums. Pomegranate.  Meats and other protein foods: Beans. Almonds. Sunflower seeds. Pine nuts. Peanuts. [place]. Salmon. Scallops. Shrimp. [place]. Tilapia. Clams. Oysters. Eggs.  Dairy: Low-fat milk. Cheese. Greek yogurt.  Beverages: Water. Red wine. Herbal tea.  Fats and oils: Extra virgin olive oil. Avocado oil. Grape seed oil.  Sweets and desserts: [place] yogurt with honey. Baked apples. Poached pears. Trail mix.  Seasoning and other foods: Basil. Cilantro. Coriander. Cumin. Mint. Parsley. Sage. Rosemary. Tarragon. Garlic. Oregano. Thyme. Pepper. Balsalmic vinegar. Tahini. Hummus. Tomato sauce. Olives. Mushrooms. ? Limit these  Grains: Prepackaged pasta or rice dishes. Prepackaged cereal with added sugar.  Vegetables: Deep fried potatoes (french fries).  Fruits: Fruit canned in syrup.  Meats and other protein  foods: Beef. Pork. Lamb. Poultry with skin. Hot dogs. [place] Salines.  Dairy: Ice cream. Sour cream. Whole milk.  Beverages: Juice. Sugar-sweetened soft drinks. Beer. Liquor and spirits.  Fats and oils: Butter. Canola oil. Vegetable oil. Beef fat (tallow).  Lard.  Sweets and desserts: Cookies. Cakes. Pies. Candy.  Seasoning and other foods: Mayonnaise. Premade sauces and marinades. The items listed may not be a complete list. Talk with your dietitian about what dietary choices are right for you. Summary  The Mediterranean diet includes both food and lifestyle choices.  Eat a variety of fresh fruits and vegetables, beans, nuts, seeds, and whole grains.  Limit the amount of red meat and sweets that you eat.  Talk with your health care provider about whether it is safe for you to drink red wine in moderation. This means 1 glass a day for nonpregnant women and 2 glasses a day for men. A glass of wine equals 5 oz (150 mL). This information is not intended to replace advice given to you by your health care provider. Make sure you discuss any questions you have with your health care provider. Document Revised: 09/23/2015 Document Reviewed: 09/16/2015 Elsevier Patient Education  [place].

## 2019-12-04 LAB — CBC WITH DIFFERENTIAL/PLATELET
Basophils Absolute: 0.1 10*3/uL (ref 0.0–0.2)
Basos: 1 %
EOS (ABSOLUTE): 0.2 10*3/uL (ref 0.0–0.4)
Eos: 3 %
Hematocrit: 41 % (ref 34.0–46.6)
Hemoglobin: 13.1 g/dL (ref 11.1–15.9)
Immature Grans (Abs): 0 10*3/uL (ref 0.0–0.1)
Immature Granulocytes: 0 %
Lymphocytes Absolute: 1.3 10*3/uL (ref 0.7–3.1)
Lymphs: 16 %
MCH: 27.1 pg (ref 26.6–33.0)
MCHC: 32 g/dL (ref 31.5–35.7)
MCV: 85 fL (ref 79–97)
Monocytes Absolute: 0.4 10*3/uL (ref 0.1–0.9)
Monocytes: 5 %
Neutrophils Absolute: 6.1 10*3/uL (ref 1.4–7.0)
Neutrophils: 75 %
Platelets: 387 10*3/uL (ref 150–450)
RBC: 4.84 x10E6/uL (ref 3.77–5.28)
RDW: 15.1 % (ref 11.7–15.4)
WBC: 8.1 10*3/uL (ref 3.4–10.8)

## 2019-12-04 LAB — LIPID PANEL W/O CHOL/HDL RATIO
Cholesterol, Total: 127 mg/dL (ref 100–199)
HDL: 51 mg/dL (ref >39)
LDL Chol Calc (NIH): 60 mg/dL (ref 0–99)
Triglycerides: 81 mg/dL (ref 0–149)
VLDL Cholesterol Cal: 16 mg/dL (ref 5–40)

## 2019-12-04 LAB — COMPREHENSIVE METABOLIC PANEL
ALT: 7 IU/L (ref 0–32)
AST: 11 IU/L (ref 0–40)
Albumin/Globulin Ratio: 1.4 (ref 1.2–2.2)
Albumin: 4.2 g/dL (ref 3.8–4.8)
Alkaline Phosphatase: 90 IU/L (ref 44–121)
BUN/Creatinine Ratio: 17 (ref 12–28)
BUN: 15 mg/dL (ref 8–27)
Bilirubin Total: 0.2 mg/dL (ref 0.0–1.2)
CO2: 28 mmol/L (ref 20–29)
Calcium: 9.8 mg/dL (ref 8.7–10.3)
Chloride: 98 mmol/L (ref 96–106)
Creatinine, Ser: 0.9 mg/dL (ref 0.57–1.00)
GFR calc Af Amer: 80 mL/min/{1.73_m2} (ref >59)
GFR calc non Af Amer: 69 mL/min/{1.73_m2} (ref >59)
Globulin, Total: 3.1 g/dL (ref 1.5–4.5)
Glucose: 144 mg/dL — ABNORMAL HIGH (ref 65–99)
Potassium: 4.1 mmol/L (ref 3.5–5.2)
Sodium: 140 mmol/L (ref 134–144)
Total Protein: 7.3 g/dL (ref 6.0–8.5)

## 2019-12-04 LAB — HEMOGLOBIN A1C
Est. average glucose Bld gHb Est-mCnc: 160 mg/dL
Hgb A1c MFr Bld: 7.2 % — ABNORMAL HIGH (ref 4.8–5.6)

## 2019-12-04 NOTE — Progress Notes (Signed)
CBC within normal limits.  CMP shows elevated glucose 144,hemoglobin A1C is eleveated still diabetes range.  Would recommend since she was not able to tolerate metformin due to GI effects to try Jardiance 10 mg po q am # 90 tablets and recheck A1c in 3 months if patient is in agreement. Increased exercise and dietary lifestyle changes also advised.   Lipid panel within normal limits now continue Lipitor 10 mg as directed.   Schedule diabetes follow up if she is willing around 3 months.

## 2019-12-08 ENCOUNTER — Telehealth: Payer: Self-pay

## 2019-12-08 NOTE — Telephone Encounter (Signed)
Only if symptomatic

## 2019-12-08 NOTE — Telephone Encounter (Signed)
-----   Message from Doreen Beam, [place] sent at 12/04/2019 12:58 PM EDT ----- CBC within normal limits.  CMP shows elevated glucose 144,hemoglobin A1C is eleveated still diabetes range.  Would recommend since she was not able to tolerate metformin due to GI effects to try Jardiance 10 mg po q am # 90 tablets and recheck A1c in 3 months if patient is in agreement. Increased exercise and dietary lifestyle changes also advised.   Lipid panel within normal limits now continue Lipitor 10 mg as directed.   Schedule diabetes follow up if she is willing around 3 months.

## 2019-12-08 NOTE — Telephone Encounter (Signed)
Patient was advised and states that she has appointment scheduled on 03/04/2020 for follow-up.  Patient states that she was exposed to [place] on 11/28/2020 and had a test done on 12/05/2019. Currently not having any symptoms and would like to know if she should have another COVID test done. Please advise.

## 2019-12-09 MED ORDER — EMPAGLIFLOZIN 10 MG PO TABS: 10.0000 mg | ORAL_TABLET | Freq: Every day | ORAL | 0 refills | Status: DC

## 2019-12-09 NOTE — Telephone Encounter (Signed)
Patient has been advised. KW 

## 2020-03-04 ENCOUNTER — Ambulatory Visit: Payer: Self-pay | Admitting: Adult Health

## 2020-03-08 ENCOUNTER — Ambulatory Visit (INDEPENDENT_AMBULATORY_CARE_PROVIDER_SITE_OTHER): Payer: 59 | Admitting: Obstetrics & Gynecology

## 2020-03-08 ENCOUNTER — Encounter: Payer: Self-pay | Admitting: Obstetrics & Gynecology

## 2020-03-08 ENCOUNTER — Other Ambulatory Visit: Payer: Self-pay

## 2020-03-08 VITALS — BP 148/80 | Ht 59.0 in | Wt 267.0 lb

## 2020-03-08 DIAGNOSIS — N921 Excessive and frequent menstruation with irregular cycle: Secondary | ICD-10-CM | POA: Diagnosis not present

## 2020-03-08 DIAGNOSIS — D219 Benign neoplasm of connective and other soft tissue, unspecified: Secondary | ICD-10-CM

## 2020-03-08 NOTE — Patient Instructions (Signed)
Abdominal Hysterectomy Abdominal hysterectomy is a surgery to remove the womb. The womb is also called the uterus. The womb is the part of the body that holds a growing baby. This surgery may be done if you have:  Cancer of the womb.  Growths in your womb.  Infection.  Pain.  Very bad bleeding.  Problems with your menstrual period.  Other health problems that affect the organs that help you get pregnant. Other parts that help in pregnancy may also be removed. These include:  The lowest part of the womb (cervix).  The organs that make eggs (ovaries).  The tubes that move the egg to the womb (fallopian tubes). Tell your doctor about:  Any allergies you have.  All medicines you are taking. This includes vitamins, herbs, eye drops, creams, and over-the-counter medicines.  Any problems you or family members have had with medicines that make you fall asleep (anesthetic medicines).  Any blood disorders you have.  Any surgeries you have had.  Any medical conditions you have.  Whether you are pregnant or may be pregnant. What are the risks? Generally, this is a safe surgery. But problems may occur, including:  Bleeding.  Infection.  Allergies.  Damage to nearby parts.  Nerve injury.  Less interest in sex.  Pain during sex.  Blood clots. What happens before the procedure? Staying hydrated Follow instructions from your doctor about hydration. These may include:  Up to 2 hours before the procedure - you may continue to drink clear liquids. These include water, clear fruit juice, black coffee, and plain tea. Eating and drinking restrictions Follow instructions from your doctor about eating and drinking. These may include:  8 hours before the procedure - stop eating heavy meals or foods. These include meat, fried foods, or fatty foods.  6 hours before the procedure - stop eating light meals or foods. These include toast or cereal.  6 hours before the procedure -  stop drinking milk or drinks that contain milk.  2 hours before the procedure - stop drinking clear liquids. Medicines  Ask your doctor about changing or stopping: ? Your normal medicines. ? Vitamins, herbs, and supplements. ? Over-the-counter medicines.  Do not take aspirin or ibuprofen unless you are told to.  You may be asked to take a medicine that helps you poop (laxative). Surgery safety For your safety, your doctor may:  [place] the area of surgery.  Remove hair at the surgery site.  Ask you to wash with a soap that kills germs.  Give you antibiotic medicine. General instructions  This surgery can affect the way you feel about yourself. Ask your doctor about the changes caused by this surgery.  You may have blood and urine tests.  Do not smoke or use any products that contain nicotine or tobacco for 4 weeks before the procedure. If you need help quitting, ask your doctor.  You may need to have an enema to clean out your butt and lower colon.  Plan to have a responsible adult take you home from the hospital or clinic. What happens during the procedure?  An IV tube will be put into one of your veins.  You may be given: ? A sedative. This medicine helps you relax. ? Anesthetics. These medicines:  Numb certain areas of your body.  Make you fall asleep for surgery.  Tight (compression) stockings will be placed on your legs. This will help with blood flow.  A thin tube will be placed to help drain your  pee (urine).  A cut (incision) will be made through the skin in your lower belly.  The body tissue that covers your womb will be moved aside. Your doctor will take out the womb and other parts if needed.  Bleeding will be stopped with clamps or stitches (sutures).  Your cut will be closed with sutures, skin glue, or skin tape.  A bandage (dressing) will be placed over the cut. The procedure may vary among doctors and hospitals. What happens after the  procedure?  You will be monitored until you leave the hospital or clinic. This includes checking your blood pressure, heart rate, breathing rate, and blood oxygen level.  You will be given medicine for pain.  You will need to stay in the hospital for 1-2 days.  You will eat a liquid diet at first.  You will still have the tube in place to drain your pee.  You may have to wear tight stockings. These stockings help to prevent blood clots and reduce swelling in your legs.  You will be asked to walk as soon as possible.  You will do breathing exercises or use a machine to help keep your lungs clear.  You may need to use a pad for fluids that come from your vagina. Summary  Abdominal hysterectomy is a surgery to remove your womb. The womb is the part of the body that holds a growing baby.  Talk with your doctor about the changes this surgery may cause. The changes can affect how you feel about yourself.  You will be given medicine for pain.  You will need to stay in the hospital for 1-2 days. This information is not intended to replace advice given to you by your health care provider. Make sure you discuss any questions you have with your health care provider. Document Revised: 09/25/2019 Document Reviewed: 09/25/2019 Elsevier Patient Education  2021 [employer].

## 2020-03-08 NOTE — Progress Notes (Signed)
Uterine Fibroids Patient is a 60 yo G40P2 AA F who presents with long standing h/o uterine fibroids. Periods are still occurring and are irreg but almost monthly, lasting several days. Dysmenorrhea:mild, occurring throughout menses. Cyclic symptoms include none. No intermenstrual discharge.   Denies bladder sx's.  Has known fibroids for year, waiting for them to shrink w menopause.  Heavier bleeding last Sept when she sought care, less so these past 2 mos.  PAP UTD normal.  Prior surgery- hernia repair upper abdomen.  Prior NSVD x2.  FH fibroids.  PMHx: She  has a past medical history of Allergy, Anemia, Anxiety, Arthritis, Diabetes mellitus without complication ([place]), Fibroids, Heart murmur, and Hypertension. Also,  has a past surgical history that includes Ventral hernia repair (N/A, 08/22/2018) and Bowel resection (N/A, 08/22/2018)., family history includes Cancer in her paternal aunt; Cirrhosis in her maternal grandmother; Endometrial cancer in her sister; Fibroids in her sister; Heart attack in her paternal uncle; Prostate cancer in her father; Uterine cancer (age of onset: 68) in her maternal aunt.,  reports that she quit smoking about 33 years ago. She has never used smokeless tobacco. She reports that she does not use drugs.  She has a current medication list which includes the following prescription(s): amlodipine, atorvastatin, empagliflozin, fluticasone, metoprolol succinate, and triamterene-hydrochlorothiazide. Also, is allergic to ace inhibitors and codeine.  Review of Systems  All other systems reviewed and are negative.   Objective: BP (!) 148/80   Ht 4\' 11"  (1.499 m)   Wt 267 lb (121.1 kg)   BMI 53.93 kg/m  Physical Exam Constitutional:      General: She is not in acute distress.    Appearance: She is well-developed.  Genitourinary:     Bladder and vagina normal.     Right Labia: No rash or tenderness.    Left Labia: No tenderness or rash.    No vaginal erythema or bleeding.       Right Adnexa: not tender and no mass present.    Left Adnexa: not tender and no mass present.    No cervical motion tenderness.     Cervical exam comments: Unable to see, very high to feel, due to fibroids.     Uterus is enlarged and mobile.     Uterine mass present.    Uterus exam comments: Uterus high position, unable to see cervix. Palpate fibroid up to umb area.     Uterus is midaxial and midaxial.     Pelvic exam was performed with patient supine.  HENT:     Head: Normocephalic and atraumatic.     Nose: Nose normal.  Abdominal:     General: There is no distension.     Palpations: Abdomen is soft.     Tenderness: There is no abdominal tenderness.  Musculoskeletal:        General: Normal range of motion.  Neurological:     Mental Status: She is alert and oriented to person, place, and time.     Cranial Nerves: No cranial nerve deficit.  Skin:    General: Skin is warm and dry.  Psychiatric:        Attention and Perception: Attention normal.        Mood and Affect: Mood and affect normal.        Speech: Speech normal.        Behavior: Behavior normal.        Thought Content: Thought content normal.        Judgment: Judgment  normal.   Unable to perform EMB due to cervix too high and out of position to do  ASSESSMENT/PLAN:    Problem List Items Addressed This Visit      Other   Leiomyoma - Primary    Other Visit Diagnoses    Metrorrhagia        Fibroid treatment such as [place], Lupron, Myomectomy, and Hysterectomy discussed in detail, with the pros and cons of each choice counseled.  No treatment as an option also discussed, as well as control of symptoms alone with hormone therapy. Information provided to the patient.  Plan [place] in 3 mos to assess for growth or regression Option of [place] or hysterectomy discussed.  Also discussed option for D&C as unable to clear from cancer risks due to inaccessibility of cervix for EMB.    Info gv on procedures that are options to  her.  A total of 25 minutes were spent face-to-face with the patient as well as preparation, review, communication, and documentation during this encounter.   Barnett Applebaum, MD, [facility], [facility] 03/08/2020  9:50 AM

## 2020-03-18 ENCOUNTER — Other Ambulatory Visit: Payer: Self-pay

## 2020-03-18 ENCOUNTER — Encounter: Payer: Self-pay | Admitting: Adult Health

## 2020-03-18 ENCOUNTER — Ambulatory Visit (INDEPENDENT_AMBULATORY_CARE_PROVIDER_SITE_OTHER): Payer: 59 | Admitting: Adult Health

## 2020-03-18 VITALS — BP 129/65 | HR 87 | Temp 98.2°F | Resp 16 | Wt 272.2 lb

## 2020-03-18 DIAGNOSIS — E1159 Type 2 diabetes mellitus with other circulatory complications: Secondary | ICD-10-CM

## 2020-03-18 DIAGNOSIS — I152 Hypertension secondary to endocrine disorders: Secondary | ICD-10-CM | POA: Diagnosis not present

## 2020-03-18 DIAGNOSIS — E785 Hyperlipidemia, unspecified: Secondary | ICD-10-CM | POA: Diagnosis not present

## 2020-03-18 DIAGNOSIS — Z6841 Body Mass Index (BMI) 40.0 and over, adult: Secondary | ICD-10-CM

## 2020-03-18 MED ORDER — EMPAGLIFLOZIN 10 MG PO TABS: 10.0000 mg | ORAL_TABLET | Freq: Every day | ORAL | 1 refills | Status: DC

## 2020-03-18 MED ORDER — ATORVASTATIN CALCIUM 10 MG PO TABS: 10.0000 mg | ORAL_TABLET | Freq: Every day | ORAL | 2 refills | Status: DC

## 2020-03-18 MED ORDER — AMLODIPINE BESYLATE 10 MG PO TABS: 10.0000 mg | ORAL_TABLET | Freq: Every day | ORAL | 2 refills | Status: DC

## 2020-03-18 MED ORDER — METOPROLOL SUCCINATE ER 50 MG PO TB24: 50.0000 mg | ORAL_TABLET | Freq: Every day | ORAL | 2 refills | Status: DC

## 2020-03-18 NOTE — Patient Instructions (Signed)
High Cholesterol  High cholesterol is a condition in which the blood has high levels of a white, waxy substance similar to fat (cholesterol). The liver makes all the cholesterol that the body needs. The human body needs small amounts of cholesterol to help build cells. A person gets extra or excess cholesterol from the food that he or she eats. The blood carries cholesterol from the liver to the rest of the body. If you have high cholesterol, deposits (plaques) may build up on the walls of your arteries. Arteries are the blood vessels that carry blood away from your heart. These plaques make the arteries narrow and stiff. Cholesterol plaques increase your risk for heart attack and stroke. Work with your health care provider to keep your cholesterol levels in a healthy range. What increases the risk? The following factors may make you more likely to develop this condition:  Eating foods that are high in animal fat (saturated fat) or cholesterol.  Being overweight.  Not getting enough exercise.  A family history of high cholesterol (familial hypercholesterolemia).  Use of tobacco products.  Having diabetes. What are the signs or symptoms? There are no symptoms of this condition. How is this diagnosed? This condition may be diagnosed based on the results of a blood test.  If you are older than 62 years of age, your health care provider may check your cholesterol levels every 4-6 years.  You may be checked more often if you have high cholesterol or other risk factors for heart disease. The blood test for cholesterol measures:  "Bad" cholesterol, or LDL cholesterol. This is the main type of cholesterol that causes heart disease. The desired level is less than 100 mg/dL.  "Good" cholesterol, or HDL cholesterol. HDL helps protect against heart disease by cleaning the arteries and carrying the LDL to the liver for processing. The desired level for HDL is 60 mg/dL or higher.  Triglycerides.  These are fats that your body can store or burn for energy. The desired level is less than 150 mg/dL.  Total cholesterol. This measures the total amount of cholesterol in your blood and includes LDL, HDL, and triglycerides. The desired level is less than 200 mg/dL. How is this treated? This condition may be treated with:  Diet changes. You may be asked to eat foods that have more fiber and less saturated fats or added sugar.  Lifestyle changes. These may include regular exercise, maintaining a healthy weight, and quitting use of tobacco products.  Medicines. These are given when diet and lifestyle changes have not worked. You may be prescribed a statin medicine to help lower your cholesterol levels. Follow these instructions at home: Eating and drinking  Eat a healthy, balanced diet. This diet includes: ? Daily servings of a variety of fresh, frozen, or canned fruits and vegetables. ? Daily servings of whole grain foods that are rich in fiber. ? Foods that are low in saturated fats and trans fats. These include poultry and fish without skin, lean cuts of meat, and low-fat dairy products. ? A variety of fish, especially oily fish that contain omega-3 fatty acids. Aim to eat fish at least 2 times a week.  Avoid foods and drinks that have added sugar.  Use healthy cooking methods, such as roasting, grilling, broiling, baking, poaching, steaming, and stir-frying. Do not fry your food except for stir-frying.   Lifestyle  Get regular exercise. Aim to exercise for a total of 150 minutes a week. Increase your activity level by doing activities   such as gardening, walking, and taking the stairs.  Do not use any products that contain nicotine or tobacco, such as cigarettes, e-cigarettes, and chewing tobacco. If you need help quitting, ask your health care provider.   General instructions  Take over-the-counter and prescription medicines only as told by your health care provider.  Keep all  follow-up visits as told by your health care provider. This is important. Where to find more information  American Heart Association: www.heart.org  National Heart, Lung, and Blood Institute: https://wilson-eaton.com/ Contact a health care provider if:  You have trouble achieving or maintaining a healthy diet or weight.  You are starting an exercise program.  You are unable to stop smoking. Get help right away if:  You have chest pain.  You have trouble breathing.  You have any symptoms of a stroke. "BE FAST" is an easy way to remember the main warning signs of a stroke: ? B - Balance. Signs are dizziness, sudden trouble walking, or loss of balance. ? E - Eyes. Signs are trouble seeing or a sudden change in vision. ? F - Face. Signs are sudden weakness or numbness of the face, or the face or eyelid drooping on one side. ? A - Arms. Signs are weakness or numbness in an arm. This happens suddenly and usually on one side of the body. ? S - Speech. Signs are sudden trouble speaking, slurred speech, or trouble understanding what people say. ? T - Time. Time to call emergency services. Write down what time symptoms started.  You have other signs of a stroke, such as: ? A sudden, severe headache with no known cause. ? Nausea or vomiting. ? Seizure. These symptoms may represent a serious problem that is an emergency. Do not wait to see if the symptoms will go away. Get medical help right away. Call your local emergency services (911 in the [place] Do not drive yourself to the hospital. Summary  Cholesterol plaques increase your risk for heart attack and stroke. Work with your health care provider to keep your cholesterol levels in a healthy range.  Eat a healthy, balanced diet, get regular exercise, and maintain a healthy weight.  Do not use any products that contain nicotine or tobacco, such as cigarettes, e-cigarettes, and chewing tobacco.  Get help right away if you have any symptoms of a  stroke. This information is not intended to replace advice given to you by your health care provider. Make sure you discuss any questions you have with your health care provider. Document Revised: 12/23/2018 Document Reviewed: 12/23/2018 Elsevier Patient Education  2021 [place]. Diabetes Mellitus and Nutrition, Adult When you have diabetes, or diabetes mellitus, it is very important to have healthy eating habits because your blood sugar (glucose) levels are greatly affected by what you eat and drink. Eating healthy foods in the right amounts, at about the same times every day, can help you:  Control your blood glucose.  Lower your risk of heart disease.  Improve your blood pressure.  Reach or maintain a healthy weight. What can affect my meal plan? Every person with diabetes is different, and each person has different needs for a meal plan. Your health care provider may recommend that you work with a dietitian to make a meal plan that is best for you. Your meal plan may vary depending on factors such as:  The calories you need.  The medicines you take.  Your weight.  Your blood glucose, blood pressure, and cholesterol levels.  Your activity level.  Other health conditions you have, such as heart or kidney disease. How do carbohydrates affect me? Carbohydrates, also called carbs, affect your blood glucose level more than any other type of food. Eating carbs naturally raises the amount of glucose in your blood. Carb counting is a method for keeping track of how many carbs you eat. Counting carbs is important to keep your blood glucose at a healthy level, especially if you use insulin or take certain oral diabetes medicines. It is important to know how many carbs you can safely have in each meal. This is different for every person. Your dietitian can help you calculate how many carbs you should have at each meal and for each snack. How does alcohol affect me? Alcohol can cause a  sudden decrease in blood glucose (hypoglycemia), especially if you use insulin or take certain oral diabetes medicines. Hypoglycemia can be a life-threatening condition. Symptoms of hypoglycemia, such as sleepiness, dizziness, and confusion, are similar to symptoms of having too much alcohol.  Do not drink alcohol if: ? Your health care provider tells you not to drink. ? You are pregnant, may be pregnant, or are planning to become pregnant.  If you drink alcohol: ? Do not drink on an empty stomach. ? Limit how much you use to:  0-1 drink a day for women.  0-2 drinks a day for men. ? Be aware of how much alcohol is in your drink. In the [place] one drink equals one 12 oz bottle of beer (355 mL), one 5 oz glass of wine (148 mL), or one 1 oz glass of hard liquor (44 mL). ? Keep yourself hydrated with water, diet soda, or unsweetened iced tea.  Keep in mind that regular soda, juice, and other mixers may contain a lot of sugar and must be counted as carbs. What are tips for following this plan? Reading food labels  Start by checking the serving size on the "Nutrition Facts" label of packaged foods and drinks. The amount of calories, carbs, fats, and other nutrients listed on the label is based on one serving of the item. Many items contain more than one serving per package.  Check the total grams (g) of carbs in one serving. You can calculate the number of servings of carbs in one serving by dividing the total carbs by 15. For example, if a food has 30 g of total carbs per serving, it would be equal to 2 servings of carbs.  Check the number of grams (g) of saturated fats and trans fats in one serving. Choose foods that have a low amount or none of these fats.  Check the number of milligrams (mg) of salt (sodium) in one serving. Most people should limit total sodium intake to less than 2,300 mg per day.  Always check the nutrition information of foods labeled as "low-fat" or "nonfat." These  foods may be higher in added sugar or refined carbs and should be avoided.  Talk to your dietitian to identify your daily goals for nutrients listed on the label. Shopping  Avoid buying canned, pre-made, or processed foods. These foods tend to be high in fat, sodium, and added sugar.  Shop around the outside edge of the grocery store. This is where you will most often find fresh fruits and vegetables, bulk grains, fresh meats, and fresh dairy. Cooking  Use low-heat cooking methods, such as baking, instead of high-heat cooking methods like deep frying.  Cook using healthy oils, such as  olive, canola, or sunflower oil.  Avoid cooking with butter, cream, or high-fat meats. Meal planning  Eat meals and snacks regularly, preferably at the same times every day. Avoid going long periods of time without eating.  Eat foods that are high in fiber, such as fresh fruits, vegetables, beans, and whole grains. Talk with your dietitian about how many servings of carbs you can eat at each meal.  Eat 4-6 oz (112-168 g) of lean protein each day, such as lean meat, chicken, fish, eggs, or tofu. One ounce (oz) of lean protein is equal to: ? 1 oz (28 g) of meat, chicken, or fish. ? 1 egg. ?  cup (62 g) of tofu.  Eat some foods each day that contain healthy fats, such as avocado, nuts, seeds, and fish.   What foods should I eat? Fruits Berries. Apples. Oranges. Peaches. Apricots. Plums. Grapes. Mango. Papaya. Pomegranate. Kiwi. Cherries. Vegetables Lettuce. Spinach. Leafy greens, including kale, chard, collard greens, and mustard greens. Beets. Cauliflower. Cabbage. Broccoli. Carrots. Green beans. Tomatoes. Peppers. Onions. Cucumbers. Brussels sprouts. Grains Whole grains, such as whole-wheat or whole-grain bread, crackers, tortillas, cereal, and pasta. Unsweetened oatmeal. Quinoa. Brown or wild rice. Meats and other proteins Seafood. Poultry without skin. Lean cuts of poultry and beef. Tofu. Nuts.  Seeds. Dairy Low-fat or fat-free dairy products such as milk, yogurt, and cheese. The items listed above may not be a complete list of foods and beverages you can eat. Contact a dietitian for more information. What foods should I avoid? Fruits Fruits canned with syrup. Vegetables Canned vegetables. Frozen vegetables with butter or cream sauce. Grains Refined white flour and flour products such as bread, pasta, snack foods, and cereals. Avoid all processed foods. Meats and other proteins Fatty cuts of meat. Poultry with skin. Breaded or fried meats. Processed meat. Avoid saturated fats. Dairy Full-fat yogurt, cheese, or milk. Beverages Sweetened drinks, such as soda or iced tea. The items listed above may not be a complete list of foods and beverages you should avoid. Contact a dietitian for more information. Questions to ask a health care provider  Do I need to meet with a diabetes educator?  Do I need to meet with a dietitian?  What number can I call if I have questions?  When are the best times to check my blood glucose? Where to find more information:  American Diabetes Association: diabetes.org  Academy of Nutrition and Dietetics: www.eatright.[employer] of Diabetes and Digestive and Kidney Diseases: [employer].dk  Association of Diabetes Care and Education Specialists: www.diabeteseducator.org Summary  It is important to have healthy eating habits because your blood sugar (glucose) levels are greatly affected by what you eat and drink.  A healthy meal plan will help you control your blood glucose and maintain a healthy lifestyle.  Your health care provider may recommend that you work with a dietitian to make a meal plan that is best for you.  Keep in mind that carbohydrates (carbs) and alcohol have immediate effects on your blood glucose levels. It is important to count carbs and to use alcohol carefully. This information is not intended to replace  advice given to you by your health care provider. Make sure you discuss any questions you have with your health care provider. Document Revised: 12/31/2018 Document Reviewed: 12/31/2018 Elsevier Patient Education  2021 [place]. Empagliflozin Oral Tablets What is this medicine? EMPAGLIFLOZIN (EM pa gli FLOE zin) helps to treat type 2 diabetes. It helps to control blood sugar. Treatment is  combined with diet and exercise. This drug may also reduce the risk of heart attack, stroke, or death if you have type 2 diabetes and risk factors for heart disease. It also treats heart failure. It may lower the risk for treatment of heart failure in the hospital. This medicine may be used for other purposes; ask your health care provider or pharmacist if you have questions. COMMON BRAND NAME(S): Jardiance What should I tell my health care provider before I take this medicine? They need to know if you have any of these conditions:  dehydration  diabetic ketoacidosis  diet low in salt  eating less due to illness, surgery, dieting, or any other reason  having surgery  high cholesterol  high levels of potassium in the blood  history of pancreatitis or pancreas problems  history of yeast infection of the penis or vagina  if you often drink alcohol  infections in the bladder, kidneys, or urinary tract  kidney disease  liver disease  low blood pressure  on hemodialysis  problems urinating  type 1 diabetes  uncircumcised female  an unusual or allergic reaction to empagliflozin, other medicines, foods, dyes, or preservatives  pregnant or trying to get pregnant  breast-feeding How should I use this medicine? Take this medicine by mouth with water. Take it as directed on the prescription label at the same time every day. You may take it with or without food. Keep taking it unless your health care provider tells you to stop. A special MedGuide will be given to you by the pharmacist  with each prescription and refill. Be sure to read this information carefully each time. Talk to your health care provider about the use of this medicine in children. Special care may be needed. Overdosage: If you think you have taken too much of this medicine contact a poison control center or emergency room at once. NOTE: This medicine is only for you. Do not share this medicine with others. What if I miss a dose? If you miss a dose, take it as soon as you can. If it is almost time for your next dose, take only that dose. Do not take double or extra doses. What may interact with this medicine?  alcohol  diuretics  insulin This list may not describe all possible interactions. Give your health care provider a list of all the medicines, herbs, non-prescription drugs, or dietary supplements you use. Also tell them if you smoke, drink alcohol, or use illegal drugs. Some items may interact with your medicine. What should I watch for while using this medicine? Visit your health care provider for regular checks on your progress. Tell your health care provider if your symptoms do not start to get better or if they get worse. This medicine can cause a serious condition in which there is too much acid in the blood. If you develop nausea, vomiting, stomach pain, unusual tiredness, or breathing problems, stop taking this medicine and call your doctor right away. If possible, use a ketone dipstick to check for ketones in your urine. Check with your health care provider if you have severe diarrhea, nausea, and vomiting, or if you sweat a lot. The loss of too much body fluid may make it dangerous for you to take this medicine. A test called the HbA1C (A1C) will be monitored. This is a simple blood test. It measures your blood sugar control over the last 2 to 3 months. You will receive this test every 3 to 6 months. Learn  how to check your blood sugar. Learn the symptoms of low and high blood sugar and how to  manage them. Always carry a quick-source of sugar with you in case you have symptoms of low blood sugar. Examples include hard sugar candy or glucose tablets. Make sure others know that you can choke if you eat or drink when you develop serious symptoms of low blood sugar, such as seizures or unconsciousness. Get medical help at once. Tell your health care provider if you have high blood sugar. You might need to change the dose of your medicine. If you are sick or exercising more than usual, you may need to change the dose of your medicine. What side effects may I notice from receiving this medicine? Side effects that you should report to your doctor or health care professional as soon as possible:  allergic reactions (skin rash, itching or hives, swelling of the face, lips, or tongue)  breathing problems  dizziness  feeling faint or lightheaded, falls  genital infection (fever; tenderness, redness, or swelling in the genitals or area from the genitals to the back of the rectum)  kidney injury (trouble passing urine or change in the amount of urine)  low blood sugar (feeling anxious; confusion; dizziness; increased hunger; unusually weak or tired; increased sweating; shakiness; cold, clammy skin; irritable; headache; blurred vision; fast heartbeat; loss of consciousness)  muscle weakness  nausea, vomiting, unusual stomach upset or pain  new pain or tenderness, change in skin color, sores or ulcers, or infection in legs or feet  penile discharge, itching, or pain  unusual tiredness  unusual vaginal discharge, itching, or odor  urinary tract infection (fever; chills; a burning feeling when urinating; urgent need to urinate more often; blood in the urine; back pain) Side effects that usually do not require medical attention (report to your doctor or health care professional if they continue or are bothersome):  mild increase in urination  thirsty This list may not describe all  possible side effects. Call your doctor for medical advice about side effects. You may report side effects to FDA at 1-800-FDA-1088. Where should I keep my medicine? Keep out of the reach of children and pets. Store at room temperature between 20 and 25 degrees C (68 and 77 degrees F). Get rid of any unused medicine after the expiration date. To get rid of medicines that are no longer needed or have expired:  Take the medicine to a medicine take-back program. Check with your pharmacy or law enforcement to find a location.  If you cannot return the medicine, check the label or package insert to see if the medicine should be thrown out in the garbage or flushed down the toilet. If you are not sure, ask your health care provider. If it is safe to put it in the trash, take the medicine out of the container. Mix the medicine with cat litter, dirt, coffee grounds, or other unwanted substance. Seal the mixture in a bag or container. Put it in the trash. NOTE: This sheet is a summary. It may not cover all possible information. If you have questions about this medicine, talk to your doctor, pharmacist, or health care provider.  2021 Elsevier/Gold Standard (2019-09-26 19:51:17) Hypertension, Adult Hypertension is another name for high blood pressure. High blood pressure forces your heart to work harder to pump blood. This can cause problems over time. There are two numbers in a blood pressure reading. There is a top number (systolic) over a bottom number (diastolic).  It is best to have a blood pressure that is below 120/80. Healthy choices can help lower your blood pressure, or you may need medicine to help lower it. What are the causes? The cause of this condition is not known. Some conditions may be related to high blood pressure. What increases the risk?  Smoking.  Having type 2 diabetes mellitus, high cholesterol, or both.  Not getting enough exercise or physical activity.  Being  overweight.  Having too much fat, sugar, calories, or salt (sodium) in your diet.  Drinking too much alcohol.  Having long-term (chronic) kidney disease.  Having a family history of high blood pressure.  Age. Risk increases with age.  Race. You may be at higher risk if you are African American.  Gender. Men are at higher risk than women before age 48. After age 41, women are at higher risk than men.  Having obstructive sleep apnea.  Stress. What are the signs or symptoms?  High blood pressure may not cause symptoms. Very high blood pressure (hypertensive crisis) may cause: ? Headache. ? Feelings of worry or nervousness (anxiety). ? Shortness of breath. ? Nosebleed. ? A feeling of being sick to your stomach (nausea). ? Throwing up (vomiting). ? Changes in how you see. ? Very bad chest pain. ? Seizures. How is this treated?  This condition is treated by making healthy lifestyle changes, such as: ? Eating healthy foods. ? Exercising more. ? Drinking less alcohol.  Your health care provider may prescribe medicine if lifestyle changes are not enough to get your blood pressure under control, and if: ? Your top number is above 130. ? Your bottom number is above 80.  Your personal target blood pressure may vary. Follow these instructions at home: Eating and drinking  If told, follow the DASH eating plan. To follow this plan: ? Fill one half of your plate at each meal with fruits and vegetables. ? Fill one fourth of your plate at each meal with whole grains. Whole grains include whole-wheat pasta, brown rice, and whole-grain bread. ? Eat or drink low-fat dairy products, such as skim milk or low-fat yogurt. ? Fill one fourth of your plate at each meal with low-fat (lean) proteins. Low-fat proteins include fish, chicken without skin, eggs, beans, and tofu. ? Avoid fatty meat, cured and processed meat, or chicken with skin. ? Avoid pre-made or processed food.  Eat less than  1,500 mg of salt each day.  Do not drink alcohol if: ? Your doctor tells you not to drink. ? You are pregnant, may be pregnant, or are planning to become pregnant.  If you drink alcohol: ? Limit how much you use to:  0-1 drink a day for women.  0-2 drinks a day for men. ? Be aware of how much alcohol is in your drink. In the [place] one drink equals one 12 oz bottle of beer (355 mL), one 5 oz glass of wine (148 mL), or one 1 oz glass of hard liquor (44 mL).   Lifestyle  Work with your doctor to stay at a healthy weight or to lose weight. Ask your doctor what the best weight is for you.  Get at least 30 minutes of exercise most days of the week. This may include walking, swimming, or biking.  Get at least 30 minutes of exercise that strengthens your muscles (resistance exercise) at least 3 days a week. This may include lifting weights or doing Pilates.  Do not use any products that contain  nicotine or tobacco, such as cigarettes, e-cigarettes, and chewing tobacco. If you need help quitting, ask your doctor.  Check your blood pressure at home as told by your doctor.  Keep all follow-up visits as told by your doctor. This is important.   Medicines  Take over-the-counter and prescription medicines only as told by your doctor. Follow directions carefully.  Do not skip doses of blood pressure medicine. The medicine does not work as well if you skip doses. Skipping doses also puts you at risk for problems.  Ask your doctor about side effects or reactions to medicines that you should watch for. Contact a doctor if you:  Think you are having a reaction to the medicine you are taking.  Have headaches that keep coming back (recurring).  Feel dizzy.  Have swelling in your ankles.  Have trouble with your vision. Get help right away if you:  Get a very bad headache.  Start to feel mixed up (confused).  Feel weak or numb.  Feel faint.  Have very bad pain in  your: ? Chest. ? Belly (abdomen).  Throw up more than once.  Have trouble breathing. Summary  Hypertension is another name for high blood pressure.  High blood pressure forces your heart to work harder to pump blood.  For most people, a normal blood pressure is less than 120/80.  Making healthy choices can help lower blood pressure. If your blood pressure does not get lower with healthy choices, you may need to take medicine. This information is not intended to replace advice given to you by your health care provider. Make sure you discuss any questions you have with your health care provider. Document Revised: 10/03/2017 Document Reviewed: 10/03/2017 Elsevier Patient Education  2021 [employer].

## 2020-03-18 NOTE — Progress Notes (Signed)
Established patient visit   Patient: Lynn Hughes   DOB: 06-04-1960   60 y.o. Female  MRN: [id number] Visit Date: 03/18/2020  Today's healthcare provider: Marcille Buffy, FNP   Chief Complaint  Patient presents with  . Hyperlipidemia  . Hypertension   Subjective    HPI  Hypertension, follow-up  BP Readings from Last 3 Encounters:  03/18/20 129/65  03/08/20 (!) 148/80  12/03/19 136/70   Wt Readings from Last 3 Encounters:  03/18/20 272 lb 3.2 oz (123.5 kg)  03/08/20 267 lb (121.1 kg)  12/03/19 270 lb 12.8 oz (122.8 kg)     She was last seen for hypertension 4 months ago.  BP at that visit was 136/70. Management since that visit includes none. Management includes metoprolol festinate 50 mg 24-hour tablet once daily.  She is also on Maxide 37.5-25 mg. Norvasc 10mg   Once po qd.  She reports excellent compliance with treatment. She is not having side effects.  She is following a Regular diet. She is not exercising. Patient states that she has joined a gym She does not smoke.  Use of agents associated with hypertension: none.  Patient  denies any fever, body aches,chills, rash, chest pain, shortness of breath, nausea, vomiting, or diarrhea.  Denies dizziness, lightheadedness, pre syncopal or syncopal episodes.   Outside blood pressures are not being checked. Symptoms: No chest pain No chest pressure  No palpitations No syncope  No dyspnea No orthopnea  No paroxysmal nocturnal dyspnea No lower extremity edema   Pertinent labs: Lab Results  Component Value Date   CHOL 127 12/03/2019   HDL 51 12/03/2019   LDLCALC 60 12/03/2019   TRIG 81 12/03/2019   CHOLHDL 3.4 09/02/2019   Lab Results  Component Value Date   NA 140 12/03/2019   K 4.1 12/03/2019   CREATININE 0.90 12/03/2019   GFRNONAA 69 12/03/2019   GFRAA 80 12/03/2019   GLUCOSE 144 (H) 12/03/2019     The ASCVD Risk score (Goff DC Jr., et al., 2013) failed to calculate for the following  reasons:   The valid total cholesterol range is 130 to 320 mg/dL   --------------------------------------------------------------------------------------------------- Lipid/Cholesterol, Follow-up  Last lipid panel Other pertinent labs  Lab Results  Component Value Date   CHOL 127 12/03/2019   HDL 51 12/03/2019   LDLCALC 60 12/03/2019   TRIG 81 12/03/2019   CHOLHDL 3.4 09/02/2019   Lab Results  Component Value Date   ALT 7 12/03/2019   AST 11 12/03/2019   PLT 387 12/03/2019   TSH 2.480 09/02/2019     She was last seen for this 4 months ago.  Management since that visit includes none. She is still currently on Liptor 10 mg daily.  She reports excellent compliance with treatment. She is not having side effects. {  Symptoms: No chest pain No chest pressure/discomfort  No dyspnea No lower extremity edema  Yes numbness or tingling of extremity No orthopnea  No palpitations No paroxysmal nocturnal dyspnea  No speech difficulty No syncope   Current diet: not asked Current exercise: none   She did not pick up her Jardiance and has not started, advise would recommend starting. Script sent to pharmacy again.  The ASCVD Risk score Mikey Bussing DC Jr., et al., 2013) failed to calculate for the following reasons:   The valid total cholesterol range is 130 to 320 mg/dL  --------------------------------------------------------------------------------------------------- Patient Active Problem List   Diagnosis Date Noted  . Abnormal vaginal bleeding 12/03/2019  .  Leiomyoma 10/27/2019  . Family history of uterine cancer 10/27/2019  . Hematuria, microscopic 09/03/2019  . History of hematuria 09/02/2019  . Hyperlipidemia 09/02/2019  . Hypertension associated with diabetes ([place]) 09/02/2019  . Body mass index (BMI) of 50-59.9 in adult ([place]) 09/02/2019  . Encounter for screening mammogram for malignant neoplasm of breast 09/02/2019  . S/P repair of ventral hernia 08/22/2018   Past Medical  History:  Diagnosis Date  . Allergy   . Anemia   . Anxiety   . Arthritis   . Diabetes mellitus without complication ([place])   . Fibroids   . Heart murmur   . Hypertension    Allergies  Allergen Reactions  . Ace Inhibitors Swelling    Swelling of face and lips  . Codeine Nausea And Vomiting       Medications: Outpatient Medications Prior to Visit  Medication Sig  . fluticasone (FLONASE) 50 MCG/ACT nasal spray Place 1 spray into both nostrils daily.  Marland Kitchen triamterene-hydrochlorothiazide (MAXZIDE-25) 37.5-25 MG tablet Take 1 tablet by mouth daily.  . [DISCONTINUED] amLODipine (NORVASC) 10 MG tablet Take 1 tablet (10 mg total) by mouth daily.  . [DISCONTINUED] atorvastatin (LIPITOR) 10 MG tablet Take 1 tablet (10 mg total) by mouth daily.  . [DISCONTINUED] empagliflozin (JARDIANCE) 10 MG TABS tablet Take 1 tablet (10 mg total) by mouth daily before breakfast.  . [DISCONTINUED] metoprolol succinate (TOPROL-XL) 50 MG 24 hr tablet Take 1 tablet (50 mg total) by mouth daily. Take with or immediately following a meal.   No facility-administered medications prior to visit.    Review of Systems  Constitutional: Negative.   HENT: Negative.   Respiratory: Negative.   Cardiovascular: Negative.   Gastrointestinal: Negative.   Genitourinary: Negative.   Musculoskeletal: Negative.   Neurological: Negative.   Hematological: Negative.   Psychiatric/Behavioral: Negative.         Objective    BP 129/65   Pulse 87   Temp 98.2 F (36.8 C) (Oral)   Resp 16   Wt 272 lb 3.2 oz (123.5 kg)   SpO2 98%   BMI 54.98 kg/m      Physical Exam Vitals reviewed.  Constitutional:      General: She is not in acute distress.    Appearance: She is obese. She is not ill-appearing, toxic-appearing or diaphoretic.  HENT:     Head: Normocephalic and atraumatic.     Right Ear: External ear normal.     Left Ear: External ear normal.     Nose: Nose normal.     Mouth/Throat:     Mouth: Mucous  membranes are moist.  Eyes:     General: No scleral icterus. Neck:     Vascular: No carotid bruit.  Cardiovascular:     Rate and Rhythm: Normal rate and regular rhythm.     Pulses: Normal pulses.     Heart sounds: Normal heart sounds. No murmur heard. No friction rub. No gallop.   Pulmonary:     Effort: Pulmonary effort is normal.     Breath sounds: Normal breath sounds.  Musculoskeletal:        General: Normal range of motion.     Cervical back: Normal range of motion and neck supple. No rigidity or tenderness.  Lymphadenopathy:     Cervical: No cervical adenopathy.  Skin:    General: Skin is warm.     Findings: No rash.  Neurological:     Mental Status: She is oriented to person, place, and time.  Psychiatric:        Mood and Affect: Mood normal.        Behavior: Behavior normal.        Thought Content: Thought content normal.        Judgment: Judgment normal.      No results found for any visits on 03/18/20.  Assessment & Plan     Expiration Jardiance  10mg  tablets lot [id number]  Expiration sep 2023  # 28 tablets given samples/  She is to check blood glucose fasting each morning. Diet and exercise advised she has joined  the gym but not gone yet.    1. Hyperlipidemia, unspecified hyperlipidemia type Continue current station.  - Comprehensive Metabolic Panel (CMET) - HgB A1c - atorvastatin (LIPITOR) 10 MG tablet; Take 1 tablet (10 mg total) by mouth daily.  Dispense: 90 tablet; Refill: 2  2. Body mass index (BMI) of 50-59.9 in adult [facility]) The patient is advised to begin progressive daily aerobic exercise program, follow a low fat, low cholesterol diet, attempt to lose weight, decrease or avoid alcohol intake, reduce salt in diet and cooking, reduce exposure to stress, improve dietary compliance, continue current medications, continue current healthy lifestyle patterns and return for routine annual checkups. - Comprehensive Metabolic Panel (CMET) - HgB A1c Weight loss  advised.  3. Hypertension associated with diabetes ([place]) Keep log at home report high or low readings for blood pressure and blood glucose.  - Comprehensive Metabolic Panel (CMET) - HgB A1c - metoprolol succinate (TOPROL-XL) 50 MG 24 hr tablet; Take 1 tablet (50 mg total) by mouth daily. Take with or immediately following a meal.  Dispense: 90 tablet; Refill: 2 - amLODipine (NORVASC) 10 MG tablet; Take 1 tablet (10 mg total) by mouth daily.  Dispense: 90 tablet; Refill: 2   Return in about 6 months (around 09/15/2020), or if symptoms worsen or fail to improve, for at any time for any worsening symptoms, Go to Emergency room/ urgent care if worse. Expiration Jardiance  10mg  tablets lot [id number]  Expiration sep 2023  # 28 tablets given samples/  She is to check blood glucose fasting each morning. Diet and exercise advised she has joined  the gym but not gone yet.   The entirety of the information documented in the History of Present Illness, Review of Systems and Physical Exam were personally obtained by me. Portions of this information were initially documented by the CMA and reviewed by me for thoroughness and accuracy.       Marcille Buffy, [place] [phone] (phone) [phone] (fax)  [place]

## 2020-03-19 ENCOUNTER — Telehealth: Payer: Self-pay

## 2020-03-19 ENCOUNTER — Other Ambulatory Visit: Payer: Self-pay | Admitting: Obstetrics & Gynecology

## 2020-03-19 ENCOUNTER — Other Ambulatory Visit: Payer: Self-pay | Admitting: Adult Health

## 2020-03-19 DIAGNOSIS — Z1231 Encounter for screening mammogram for malignant neoplasm of breast: Secondary | ICD-10-CM

## 2020-03-19 LAB — COMPREHENSIVE METABOLIC PANEL
ALT: 10 IU/L (ref 0–32)
AST: 10 IU/L (ref 0–40)
Albumin/Globulin Ratio: 1.4 (ref 1.2–2.2)
Albumin: 4.2 g/dL (ref 3.8–4.8)
Alkaline Phosphatase: 82 IU/L (ref 44–121)
BUN/Creatinine Ratio: 17 (ref 12–28)
BUN: 13 mg/dL (ref 8–27)
Bilirubin Total: 0.3 mg/dL (ref 0.0–1.2)
CO2: 28 mmol/L (ref 20–29)
Calcium: 9.9 mg/dL (ref 8.7–10.3)
Chloride: 99 mmol/L (ref 96–106)
Creatinine, Ser: 0.77 mg/dL (ref 0.57–1.00)
GFR calc Af Amer: 96 mL/min/{1.73_m2} (ref >59)
GFR calc non Af Amer: 84 mL/min/{1.73_m2} (ref >59)
Globulin, Total: 3 g/dL (ref 1.5–4.5)
Glucose: 141 mg/dL — ABNORMAL HIGH (ref 65–99)
Potassium: 4.3 mmol/L (ref 3.5–5.2)
Sodium: 142 mmol/L (ref 134–144)
Total Protein: 7.2 g/dL (ref 6.0–8.5)

## 2020-03-19 LAB — HEMOGLOBIN A1C
Est. average glucose Bld gHb Est-mCnc: 163 mg/dL
Hgb A1c MFr Bld: 7.3 % — ABNORMAL HIGH (ref 4.8–5.6)

## 2020-03-19 NOTE — Telephone Encounter (Signed)
-----   Message from Gae Dry, MD sent at 03/19/2020  4:36 PM EST ----- Regarding: MMG! Pt still has not had MMG done.  Please call and encourage her to do so, and even offer to help schedule the MMG if she feels this would help her.  The number is [phone] to schedule at [facility].  Do not let her hang up without a plan to have this done.  It is very important to Dr Kenton Kingfisher to have this MMG done.  Thanks for the help.

## 2020-03-19 NOTE — Telephone Encounter (Signed)
Pt states she will schedule her mammogram  

## 2020-03-20 NOTE — Progress Notes (Signed)
Glucose still elevated and hemoglobin A1C is 7.3 she had not been taking Jardiance should start as ordered and recheck in 3 months. Nutrition referral  recommended if she will go for this. Increase exercise and make dietary lifestyle changes. Keep log of morning blood glucose fasting at home and bring to the next visit if willing.

## 2020-03-22 ENCOUNTER — Other Ambulatory Visit: Payer: Self-pay | Admitting: Adult Health

## 2020-03-22 ENCOUNTER — Telehealth: Payer: Self-pay

## 2020-03-22 DIAGNOSIS — I152 Hypertension secondary to endocrine disorders: Secondary | ICD-10-CM

## 2020-03-22 MED ORDER — BLOOD GLUCOSE METER KIT: PACK | 1 refills | Status: DC

## 2020-03-22 NOTE — Telephone Encounter (Signed)
Patient was advised of lab results, she requests a prescription for glucose meter, test strips and lancets. KW

## 2020-03-22 NOTE — Telephone Encounter (Signed)
-----   Message from Lynn Hughes, [place] sent at 03/20/2020 10:53 PM EST ----- Glucose still elevated and hemoglobin A1C is 7.3 she had not been taking Jardiance should start as ordered and recheck in 3 months. Nutrition referral  recommended if she will go for this. Increase exercise and make dietary lifestyle changes. Keep log of morning blood glucose fasting at home and bring to the next visit if willing.

## 2020-03-22 NOTE — Progress Notes (Signed)
Meds ordered this encounter  Medications  . blood glucose meter kit and supplies    Sig: Dispense based on patient and insurance preference. Check fasting am glucose as well as before meals for total four times daily.  (FOR ICD-10 E10.9, E11.9).Hypertension associated with diabetes ([place])    Dispense:  1 each    Refill:  1    Order Specific Question:   Number of strips    Answer:   180    Order Specific Question:   Number of lancets    Answer:   894

## 2020-03-22 NOTE — Telephone Encounter (Signed)
Meter prescription and supplies should have printed, please have faxed to pharmacy of choice.

## 2020-03-23 NOTE — Telephone Encounter (Signed)
Patient has been advised. KW 

## 2020-04-06 ENCOUNTER — Telehealth: Payer: Self-pay

## 2020-04-06 NOTE — Telephone Encounter (Signed)
Copied from [place] [id number]. Topic: General - Other >> Apr 06, 2020  2:37 PM Pawlus, Brayton Layman A wrote: Reason for CRM: Pt stated [facility] is still waiting for a PA for her medication empagliflozin (JARDIANCE) 10 MG TABS tablet. Pt wants a call back regarding what is going on with her meds.

## 2020-04-06 NOTE — Telephone Encounter (Signed)
See below we discussed.

## 2020-04-06 NOTE — Telephone Encounter (Signed)
Pre-authroization was approved through cover my meds on 04/05/20, pharmacy was advised and patient was advised. KW

## 2020-04-14 ENCOUNTER — Other Ambulatory Visit: Payer: Self-pay | Admitting: Obstetrics & Gynecology

## 2020-05-11 ENCOUNTER — Other Ambulatory Visit: Payer: Self-pay | Admitting: Obstetrics & Gynecology

## 2020-05-11 DIAGNOSIS — D219 Benign neoplasm of connective and other soft tissue, unspecified: Secondary | ICD-10-CM

## 2020-05-20 ENCOUNTER — Other Ambulatory Visit: Payer: Self-pay

## 2020-05-20 ENCOUNTER — Ambulatory Visit
Admission: RE | Admit: 2020-05-20 | Discharge: 2020-05-20 | Disposition: A | Discharge status: Home / Self Care | Payer: 59 | Source: Ambulatory Visit | Attending: Obstetrics & Gynecology | Admitting: Obstetrics & Gynecology

## 2020-05-20 DIAGNOSIS — Z1231 Encounter for screening mammogram for malignant neoplasm of breast: Secondary | ICD-10-CM | POA: Insufficient documentation

## 2020-06-04 ENCOUNTER — Other Ambulatory Visit: Payer: 59

## 2020-06-04 ENCOUNTER — Ambulatory Visit: Payer: 59 | Admitting: Obstetrics & Gynecology

## 2020-06-07 ENCOUNTER — Encounter: Payer: Self-pay | Admitting: Obstetrics & Gynecology

## 2020-06-16 ENCOUNTER — Other Ambulatory Visit: Payer: Self-pay

## 2020-06-16 ENCOUNTER — Ambulatory Visit: Payer: 59 | Admitting: Adult Health

## 2020-06-16 ENCOUNTER — Encounter: Payer: Self-pay | Admitting: Adult Health

## 2020-06-16 VITALS — BP 112/62 | HR 90 | Temp 98.4°F | Ht 59.02 in | Wt 262.8 lb

## 2020-06-16 DIAGNOSIS — E785 Hyperlipidemia, unspecified: Secondary | ICD-10-CM

## 2020-06-16 DIAGNOSIS — J301 Allergic rhinitis due to pollen: Secondary | ICD-10-CM

## 2020-06-16 DIAGNOSIS — E1159 Type 2 diabetes mellitus with other circulatory complications: Secondary | ICD-10-CM | POA: Diagnosis not present

## 2020-06-16 DIAGNOSIS — Z87898 Personal history of other specified conditions: Secondary | ICD-10-CM

## 2020-06-16 DIAGNOSIS — E559 Vitamin D deficiency, unspecified: Secondary | ICD-10-CM | POA: Diagnosis not present

## 2020-06-16 DIAGNOSIS — I152 Hypertension secondary to endocrine disorders: Secondary | ICD-10-CM | POA: Diagnosis not present

## 2020-06-16 DIAGNOSIS — Z6841 Body Mass Index (BMI) 40.0 and over, adult: Secondary | ICD-10-CM

## 2020-06-16 LAB — COMPREHENSIVE METABOLIC PANEL
ALT: 9 U/L (ref 0–35)
AST: 9 U/L (ref 0–37)
Albumin: 4.1 g/dL (ref 3.5–5.2)
Alkaline Phosphatase: 75 U/L (ref 39–117)
BUN: 16 mg/dL (ref 6–23)
CO2: 31 mEq/L (ref 19–32)
Calcium: 10 mg/dL (ref 8.4–10.5)
Chloride: 100 mEq/L (ref 96–112)
Creatinine, Ser: 0.91 mg/dL (ref 0.40–1.20)
GFR: 68.01 mL/min (ref >60.00)
Glucose, Bld: 127 mg/dL — ABNORMAL HIGH (ref 70–99)
Potassium: 3.9 mEq/L (ref 3.5–5.1)
Sodium: 140 mEq/L (ref 135–145)
Total Bilirubin: 0.4 mg/dL (ref 0.2–1.2)
Total Protein: 7.3 g/dL (ref 6.0–8.3)

## 2020-06-16 LAB — CBC WITH DIFFERENTIAL/PLATELET
Basophils Absolute: 0.1 10*3/uL (ref 0.0–0.1)
Basophils Relative: 1 % (ref 0.0–3.0)
Eosinophils Absolute: 0.2 10*3/uL (ref 0.0–0.7)
Eosinophils Relative: 3.1 % (ref 0.0–5.0)
HCT: 41.9 % (ref 36.0–46.0)
Hemoglobin: 13.5 g/dL (ref 12.0–15.0)
Lymphocytes Relative: 14.9 % (ref 12.0–46.0)
Lymphs Abs: 1.1 10*3/uL (ref 0.7–4.0)
MCHC: 32.2 g/dL (ref 30.0–36.0)
MCV: 80.2 fl (ref 78.0–100.0)
Monocytes Absolute: 0.4 10*3/uL (ref 0.1–1.0)
Monocytes Relative: 5.8 % (ref 3.0–12.0)
Neutro Abs: 5.8 10*3/uL (ref 1.4–7.7)
Neutrophils Relative %: 75.2 % (ref 43.0–77.0)
Platelets: 315 10*3/uL (ref 150.0–400.0)
RBC: 5.22 Mil/uL — ABNORMAL HIGH (ref 3.87–5.11)
RDW: 17.7 % — ABNORMAL HIGH (ref 11.5–15.5)
WBC: 7.6 10*3/uL (ref 4.0–10.5)

## 2020-06-16 LAB — HEMOGLOBIN A1C: Hgb A1c MFr Bld: 7.1 % — ABNORMAL HIGH (ref 4.6–6.5)

## 2020-06-16 LAB — TSH: TSH: 2.52 u[IU]/mL (ref 0.35–4.50)

## 2020-06-16 LAB — VITAMIN D 25 HYDROXY (VIT D DEFICIENCY, FRACTURES): VITD: 36.24 ng/mL (ref 30.00–100.00)

## 2020-06-16 MED ORDER — METOPROLOL SUCCINATE ER 50 MG PO TB24: 50.0000 mg | ORAL_TABLET | Freq: Every day | ORAL | 2 refills | Status: DC

## 2020-06-16 MED ORDER — FLUTICASONE PROPIONATE 50 MCG/ACT NA SUSP: 1.0000 | Freq: Every day | NASAL | 4 refills | Status: DC

## 2020-06-16 MED ORDER — ATORVASTATIN CALCIUM 10 MG PO TABS: 10.0000 mg | ORAL_TABLET | Freq: Every day | ORAL | 2 refills | Status: DC

## 2020-06-16 MED ORDER — EMPAGLIFLOZIN 10 MG PO TABS: 10.0000 mg | ORAL_TABLET | Freq: Every day | ORAL | 1 refills | Status: DC

## 2020-06-16 MED ORDER — AMLODIPINE BESYLATE 10 MG PO TABS
10.0000 mg | ORAL_TABLET | Freq: Every day | ORAL | 2 refills | Status: DC
Start: 2020-06-16 — End: 2021-02-01

## 2020-06-16 MED ORDER — TRIAMTERENE-HCTZ 37.5-25 MG PO TABS: 1.0000 | ORAL_TABLET | Freq: Every day | ORAL | 2 refills | Status: DC

## 2020-06-16 NOTE — Progress Notes (Signed)
Acute Office Visit  Subjective:    Patient ID: Lynn Hughes, female    DOB: [birth year], 60 y.o.   MRN: [id number]  Chief Complaint  Patient presents with  . Blood pressure check  . Medication Refill    HPI Patient is in today for blood pressure recheck   Blood sugars 146 this am, has not missed any , has been doing better average around 120. Taking Jardiance as prescribed.   Working on diet and exercise,  Seeing Dr. Amalia Hailey for large fibroid tomorrow has ultrasound scheduled.   Is still hesitant to do colonoscopy or cologuard at this time but reports she will in future.   She had  Mild dizziness a week ago when she got up suddenly lasted few seconds and thinks she just got up to slowly. Blood pressure at home have been within normal limits.   Denies pre syncope or syncope.   Patient  denies any fever, body aches,chills, rash, chest pain, shortness of breath, nausea, vomiting, or diarrhea.  Denies  lightheadedness, pre syncopal or syncopal episodes.    Past Medical History:  Diagnosis Date  . Allergy   . Anemia   . Anxiety   . Arthritis   . Diabetes mellitus without complication ([place])   . Fibroids   . Heart murmur   . Hypertension     Past Surgical History:  Procedure Laterality Date  . BOWEL RESECTION N/A 08/22/2018   Procedure: SMALL BOWEL RESECTION;  Surgeon: Jules Husbands, MD;  Location: [facility] ORS;  Service: General;  Laterality: N/A;  . BREAST EXCISIONAL BIOPSY Left 1995   surgical exc calcs neg   . VENTRAL HERNIA REPAIR N/A 08/22/2018   Procedure: HERNIA REPAIR VENTRAL ADULT;  Surgeon: Jules Husbands, MD;  Location: [facility] ORS;  Service: General;  Laterality: N/A;    Family History  Problem Relation Age of Onset  . Prostate cancer Father   . Uterine cancer Maternal Aunt 40  . Cancer Paternal Aunt        not sure  . Heart attack Paternal Uncle        x2 uncles  . Cirrhosis Maternal Grandmother   . Endometrial cancer Sister   . Fibroids Sister      Social History   Socioeconomic History  . Marital status: Single    Spouse name: Not on file  . Number of children: Not on file  . Years of education: Not on file  . Highest education level: Not on file  Occupational History  . Not on file  Tobacco Use  . Smoking status: Former Smoker    Quit date: 09/04/1986    Years since quitting: 33.8  . Smokeless tobacco: Never Used  Vaping Use  . Vaping Use: Never used  Substance and Sexual Activity  . Alcohol use: Not on file    Comment: occassionally  . Drug use: Never  . Sexual activity: Not Currently    Birth control/protection: None  Other Topics Concern  . Not on file  Social History Narrative  . Not on file   Social Determinants of Health   Financial Resource Strain: Not on file  Food Insecurity: Not on file  Transportation Needs: Not on file  Physical Activity: Not on file  Stress: Not on file  Social Connections: Not on file  Intimate Partner Violence: Not on file    Outpatient Medications Prior to Visit  Medication Sig Dispense Refill  . blood glucose meter kit and supplies Dispense based on patient  and insurance preference. Check fasting am glucose as well as before meals for total four times daily.  (FOR ICD-10 E10.9, E11.9).Hypertension associated with diabetes ([place]) 1 each 1  . Lancets (ONETOUCH DELICA PLUS [id number]) MISC     . ONETOUCH ULTRA test strip     . amLODipine (NORVASC) 10 MG tablet Take 1 tablet (10 mg total) by mouth daily. 90 tablet 2  . atorvastatin (LIPITOR) 10 MG tablet Take 1 tablet (10 mg total) by mouth daily. 90 tablet 2  . empagliflozin (JARDIANCE) 10 MG TABS tablet Take 1 tablet (10 mg total) by mouth daily. 90 tablet 1  . fluticasone (FLONASE) 50 MCG/ACT nasal spray Place 1 spray into both nostrils daily. 16 mL 4  . metoprolol succinate (TOPROL-XL) 50 MG 24 hr tablet Take 1 tablet (50 mg total) by mouth daily. Take with or immediately following a meal. 90 tablet 2  .  triamterene-hydrochlorothiazide (MAXZIDE-25) 37.5-25 MG tablet Take 1 tablet by mouth daily. 90 tablet 2   No facility-administered medications prior to visit.    Allergies  Allergen Reactions  . Ace Inhibitors Swelling    Swelling of face and lips  . Codeine Nausea And Vomiting    Review of Systems  Constitutional: Negative.   HENT: Negative.   Respiratory: Negative.   Cardiovascular: Negative.   Gastrointestinal: Negative.   Genitourinary: Negative.   Musculoskeletal: Negative.   Neurological: Positive for dizziness (not continuously was episodic x 2 none since. ). Negative for tremors, seizures, syncope, facial asymmetry, speech difficulty, weakness, light-headedness, numbness and headaches.  Hematological: Negative.   Psychiatric/Behavioral: Negative.        Objective:    Physical Exam Vitals reviewed.  Constitutional:      General: She is not in acute distress.    Appearance: She is well-developed. She is obese. She is not ill-appearing, toxic-appearing or diaphoretic.     Interventions: She is not intubated. HENT:     Head: Normocephalic and atraumatic.     Right Ear: External ear normal.     Left Ear: External ear normal.     Nose: Nose normal.     Mouth/Throat:     Pharynx: No oropharyngeal exudate or posterior oropharyngeal erythema.  Eyes:     General: Lids are normal. No scleral icterus.       Right eye: No discharge.        Left eye: No discharge.     Conjunctiva/sclera: Conjunctivae normal.     Right eye: Right conjunctiva is not injected. No exudate or hemorrhage.    Left eye: Left conjunctiva is not injected. No exudate or hemorrhage.    Pupils: Pupils are equal, round, and reactive to light.  Neck:     Thyroid: No thyroid mass or thyromegaly.     Vascular: Normal carotid pulses. No carotid bruit, hepatojugular reflux or JVD.     Trachea: Trachea and phonation normal. No tracheal tenderness or tracheal deviation.     Meningeal: Brudzinski's sign and  Kernig's sign absent.  Cardiovascular:     Rate and Rhythm: Normal rate and regular rhythm.     Pulses: Normal pulses.          Radial pulses are 2+ on the right side and 2+ on the left side.       Dorsalis pedis pulses are 2+ on the right side and 2+ on the left side.       Posterior tibial pulses are 2+ on the right side and 2+ on the  left side.     Heart sounds: Normal heart sounds, S1 normal and S2 normal. Heart sounds not distant. No murmur heard. No friction rub. No gallop.   Pulmonary:     Effort: Pulmonary effort is normal. No tachypnea, bradypnea, accessory muscle usage or respiratory distress. She is not intubated.     Breath sounds: Normal breath sounds. No stridor. No wheezing, rhonchi or rales.  Chest:     Chest wall: No tenderness.  Breasts:     Right: No supraclavicular adenopathy.     Left: No supraclavicular adenopathy.    Abdominal:     General: Bowel sounds are normal. There is no distension or abdominal bruit.     Palpations: Abdomen is soft. There is no shifting dullness, fluid wave, hepatomegaly, splenomegaly, mass or pulsatile mass.     Tenderness: There is no abdominal tenderness. There is no right CVA tenderness, left CVA tenderness, guarding or rebound.     Hernia: No hernia is present.  Musculoskeletal:        General: No tenderness or deformity. Normal range of motion.     Cervical back: Full passive range of motion without pain, normal range of motion and neck supple. No edema, erythema or rigidity. No spinous process tenderness or muscular tenderness. Normal range of motion.  Lymphadenopathy:     Head:     Right side of head: No submental, submandibular, tonsillar, preauricular, posterior auricular or occipital adenopathy.     Left side of head: No submental, submandibular, tonsillar, preauricular, posterior auricular or occipital adenopathy.     Cervical: No cervical adenopathy.     Right cervical: No superficial, deep or posterior cervical adenopathy.     Left cervical: No superficial, deep or posterior cervical adenopathy.     Upper Body:     Right upper body: No supraclavicular or pectoral adenopathy.     Left upper body: No supraclavicular or pectoral adenopathy.  Skin:    General: Skin is warm and dry.     Coloration: Skin is not pale.     Findings: No abrasion, bruising, burn, ecchymosis, erythema, lesion, petechiae or rash.     Nails: There is no clubbing.  Neurological:     Mental Status: She is alert and oriented to person, place, and time.     GCS: GCS eye subscore is 4. GCS verbal subscore is 5. GCS motor subscore is 6.     Cranial Nerves: No cranial nerve deficit.     Sensory: No sensory deficit.     Motor: No weakness, tremor, atrophy, abnormal muscle tone or seizure activity.     Coordination: Coordination normal.     Gait: Gait normal.     Deep Tendon Reflexes: Reflexes are normal and symmetric. Reflexes normal. Babinski sign absent on the right side. Babinski sign absent on the left side.     Reflex Scores:      Tricep reflexes are 2+ on the right side and 2+ on the left side.      Bicep reflexes are 2+ on the right side and 2+ on the left side.      Brachioradialis reflexes are 2+ on the right side and 2+ on the left side.      Patellar reflexes are 2+ on the right side and 2+ on the left side.      Achilles reflexes are 2+ on the right side and 2+ on the left side. Psychiatric:        Speech: Speech normal.  Behavior: Behavior normal.        Thought Content: Thought content normal.        Judgment: Judgment normal.     BP 112/62 (BP Location: Left Arm, Patient Position: Sitting)   Pulse 90   Temp 98.4 F (36.9 C)   Ht 4' 11.02" (1.499 m)   Wt 262 lb 12 oz (119.2 kg)   SpO2 98%   BMI 53.04 kg/m  Wt Readings from Last 3 Encounters:  06/16/20 262 lb 12 oz (119.2 kg)  03/18/20 272 lb 3.2 oz (123.5 kg)  03/08/20 267 lb (121.1 kg)    Health Maintenance Due  Topic Date Due  . URINE MICROALBUMIN  Never  done    There are no preventive care reminders to display for this patient. FINDINGS: There are no findings suspicious for malignancy. The images were evaluated with computer-aided detection.  IMPRESSION: No mammographic evidence of malignancy. A result letter of this screening mammogram will be mailed directly to the patient.  RECOMMENDATION: Screening mammogram in one year. (Code:[id number]-B-01Y)  BI-RADS CATEGORY  1: Negative.   Electronically Signed   By: Nolon Nations M.D.   On: 06/07/2020 09:31     Result History  MM 3D SCREEN BREAST BILATERAL (Order #[id number]) on 06/07/2020 - Order Result History Report  MyChart Results Release  MyChart Status: Pending     Lab Results  Component Value Date   TSH 2.480 09/02/2019   Lab Results  Component Value Date   WBC 8.1 12/03/2019   HGB 13.1 12/03/2019   HCT 41.0 12/03/2019   MCV 85 12/03/2019   PLT 387 12/03/2019   Lab Results  Component Value Date   NA 142 03/18/2020   K 4.3 03/18/2020   CO2 28 03/18/2020   GLUCOSE 141 (H) 03/18/2020   BUN 13 03/18/2020   CREATININE 0.77 03/18/2020   BILITOT 0.3 03/18/2020   ALKPHOS 82 03/18/2020   AST 10 03/18/2020   ALT 10 03/18/2020   PROT 7.2 03/18/2020   ALBUMIN 4.2 03/18/2020   CALCIUM 9.9 03/18/2020   ANIONGAP 6 08/24/2018   Lab Results  Component Value Date   CHOL 127 12/03/2019   Lab Results  Component Value Date   HDL 51 12/03/2019   Lab Results  Component Value Date   LDLCALC 60 12/03/2019   Lab Results  Component Value Date   TRIG 81 12/03/2019   Lab Results  Component Value Date   CHOLHDL 3.4 09/02/2019   Lab Results  Component Value Date   HGBA1C 7.3 (H) 03/18/2020       Assessment & Plan:   Problem List Items Addressed This Visit      Cardiovascular and Mediastinum   Hypertension associated with diabetes ([place]) - Primary   Relevant Medications   ONETOUCH ULTRA test strip   Lancets (ONETOUCH DELICA PLUS [id number]) MISC    amLODipine (NORVASC) 10 MG tablet   atorvastatin (LIPITOR) 10 MG tablet   empagliflozin (JARDIANCE) 10 MG TABS tablet   metoprolol succinate (TOPROL-XL) 50 MG 24 hr tablet   triamterene-hydrochlorothiazide (MAXZIDE-25) 37.5-25 MG tablet   Other Relevant Orders   CBC with Differential/Platelet   Comprehensive metabolic panel   HgB E0P   TSH   Urine Microalbumin w/creat. ratio     Other   Hyperlipidemia   Relevant Medications   amLODipine (NORVASC) 10 MG tablet   atorvastatin (LIPITOR) 10 MG tablet   metoprolol succinate (TOPROL-XL) 50 MG 24 hr tablet   triamterene-hydrochlorothiazide (MAXZIDE-25) 37.5-25 MG tablet  Other Relevant Orders   Lipid Panel w/o Chol/HDL Ratio   Body mass index (BMI) of 50-59.9 in adult [facility])   Relevant Medications   empagliflozin (JARDIANCE) 10 MG TABS tablet   H/O dizziness   Vitamin D deficiency   Relevant Orders   VITAMIN D 25 Hydroxy (Vit-D Deficiency, Fractures)    Other Visit Diagnoses    Seasonal allergic rhinitis due to pollen       Relevant Medications   fluticasone (FLONASE) 50 MCG/ACT nasal spray    will continue current medications, as directed, monitor blood pressure at home, increase hydration, and call office if low or if any more dizziness occurs.  May need to decrease Maxide or change to just HCTZ.  Also monitor glucose if dizzy and as directed otherwise report any high or low readings as discussed.    Orders Placed This Encounter  Procedures  . CBC with Differential/Platelet  . Comprehensive metabolic panel  . HgB A1c  . Lipid Panel w/o Chol/HDL Ratio  . TSH  . Urine Microalbumin w/creat. ratio  . VITAMIN D 25 Hydroxy (Vit-D Deficiency, Fractures)    colonoscopy or colo guard still advised she will reach out once ready.     Return precautions given. Red Flags discussed. The patient was given clear instructions to go to ER or return to medical center if any red flags develop, symptoms do not improve, worsen or new  problems develop. They verbalized understanding.    Risks, benefits, and alternatives of the medications and treatment plan prescribed today were discussed, and patient expressed understanding.    Education regarding symptom management and diagnosis given to patient on AVS.  Patient was in agreement with treatment plan.   Continue to follow with  Kelby Aline. Idara Woodside AGNP-C, FNP-C for routine health maintenance.   Kelby Aline. Kahleb Mcclane AGNP-C, FNP-C Return in about 6 months (around 12/17/2020), or if symptoms worsen or fail to improve, for at any time for any worsening symptoms, Go to Emergency room/ urgent care if worse.   Meds ordered this encounter  Medications  . amLODipine (NORVASC) 10 MG tablet    Sig: Take 1 tablet (10 mg total) by mouth daily.    Dispense:  90 tablet    Refill:  2  . atorvastatin (LIPITOR) 10 MG tablet    Sig: Take 1 tablet (10 mg total) by mouth daily.    Dispense:  90 tablet    Refill:  2  . empagliflozin (JARDIANCE) 10 MG TABS tablet    Sig: Take 1 tablet (10 mg total) by mouth daily.    Dispense:  90 tablet    Refill:  1  . fluticasone (FLONASE) 50 MCG/ACT nasal spray    Sig: Place 1 spray into both nostrils daily.    Dispense:  16 mL    Refill:  4  . metoprolol succinate (TOPROL-XL) 50 MG 24 hr tablet    Sig: Take 1 tablet (50 mg total) by mouth daily. Take with or immediately following a meal.    Dispense:  90 tablet    Refill:  2  . triamterene-hydrochlorothiazide (MAXZIDE-25) 37.5-25 MG tablet    Sig: Take 1 tablet by mouth daily.    Dispense:  90 tablet    Refill:  2     Marcille Buffy, FNP

## 2020-06-16 NOTE — Patient Instructions (Addendum)
Diabetes Mellitus and Nutrition, Adult When you have diabetes, or diabetes mellitus, it is very important to have healthy eating habits because your blood sugar (glucose) levels are greatly affected by what you eat and drink. Eating healthy foods in the right amounts, at about the same times every day, can help you:  Control your blood glucose.  Lower your risk of heart disease.  Improve your blood pressure.  Reach or maintain a healthy weight. What can affect my meal plan? Every person with diabetes is different, and each person has different needs for a meal plan. Your health care provider may recommend that you work with a dietitian to make a meal plan that is best for you. Your meal plan may vary depending on factors such as:  The calories you need.  The medicines you take.  Your weight.  Your blood glucose, blood pressure, and cholesterol levels.  Your activity level.  Other health conditions you have, such as heart or kidney disease. How do carbohydrates affect me? Carbohydrates, also called carbs, affect your blood glucose level more than any other type of food. Eating carbs naturally raises the amount of glucose in your blood. Carb counting is a method for keeping track of how many carbs you eat. Counting carbs is important to keep your blood glucose at a healthy level, especially if you use insulin or take certain oral diabetes medicines. It is important to know how many carbs you can safely have in each meal. This is different for every person. Your dietitian can help you calculate how many carbs you should have at each meal and for each snack. How does alcohol affect me? Alcohol can cause a sudden decrease in blood glucose (hypoglycemia), especially if you use insulin or take certain oral diabetes medicines. Hypoglycemia can be a life-threatening condition. Symptoms of hypoglycemia, such as sleepiness, dizziness, and confusion, are similar to symptoms of having too much  alcohol.  Do not drink alcohol if: ? Your health care provider tells you not to drink. ? You are pregnant, may be pregnant, or are planning to become pregnant.  If you drink alcohol: ? Do not drink on an empty stomach. ? Limit how much you use to:  0-1 drink a day for women.  0-2 drinks a day for men. ? Be aware of how much alcohol is in your drink. In the [place] one drink equals one 12 oz bottle of beer (355 mL), one 5 oz glass of wine (148 mL), or one 1 oz glass of hard liquor (44 mL). ? Keep yourself hydrated with water, diet soda, or unsweetened iced tea.  Keep in mind that regular soda, juice, and other mixers may contain a lot of sugar and must be counted as carbs. What are tips for following this plan? Reading food labels  Start by checking the serving size on the "Nutrition Facts" label of packaged foods and drinks. The amount of calories, carbs, fats, and other nutrients listed on the label is based on one serving of the item. Many items contain more than one serving per package.  Check the total grams (g) of carbs in one serving. You can calculate the number of servings of carbs in one serving by dividing the total carbs by 15. For example, if a food has 30 g of total carbs per serving, it would be equal to 2 servings of carbs.  Check the number of grams (g) of saturated fats and trans fats in one serving. Choose foods that have   a low amount or none of these fats.  Check the number of milligrams (mg) of salt (sodium) in one serving. Most people should limit total sodium intake to less than 2,300 mg per day.  Always check the nutrition information of foods labeled as "low-fat" or "nonfat." These foods may be higher in added sugar or refined carbs and should be avoided.  Talk to your dietitian to identify your daily goals for nutrients listed on the label. Shopping  Avoid buying canned, pre-made, or processed foods. These foods tend to be high in fat, sodium, and added  sugar.  Shop around the outside edge of the grocery store. This is where you will most often find fresh fruits and vegetables, bulk grains, fresh meats, and fresh dairy. Cooking  Use low-heat cooking methods, such as baking, instead of high-heat cooking methods like deep frying.  Cook using healthy oils, such as olive, canola, or sunflower oil.  Avoid cooking with butter, cream, or high-fat meats. Meal planning  Eat meals and snacks regularly, preferably at the same times every day. Avoid going long periods of time without eating.  Eat foods that are high in fiber, such as fresh fruits, vegetables, beans, and whole grains. Talk with your dietitian about how many servings of carbs you can eat at each meal.  Eat 4-6 oz (112-168 g) of lean protein each day, such as lean meat, chicken, fish, eggs, or tofu. One ounce (oz) of lean protein is equal to: ? 1 oz (28 g) of meat, chicken, or fish. ? 1 egg. ?  cup (62 g) of tofu.  Eat some foods each day that contain healthy fats, such as avocado, nuts, seeds, and fish.   What foods should I eat? Fruits Berries. Apples. Oranges. Peaches. Apricots. Plums. Grapes. Mango. Papaya. Pomegranate. Kiwi. Cherries. Vegetables Lettuce. Spinach. Leafy greens, including kale, chard, collard greens, and mustard greens. Beets. Cauliflower. Cabbage. Broccoli. Carrots. Green beans. Tomatoes. Peppers. Onions. Cucumbers. Brussels sprouts. Grains Whole grains, such as whole-wheat or whole-grain bread, crackers, tortillas, cereal, and pasta. Unsweetened oatmeal. Quinoa. Brown or wild rice. Meats and other proteins Seafood. Poultry without skin. Lean cuts of poultry and beef. Tofu. Nuts. Seeds. Dairy Low-fat or fat-free dairy products such as milk, yogurt, and cheese. The items listed above may not be a complete list of foods and beverages you can eat. Contact a dietitian for more information. What foods should I avoid? Fruits Fruits canned with  syrup. Vegetables Canned vegetables. Frozen vegetables with butter or cream sauce. Grains Refined white flour and flour products such as bread, pasta, snack foods, and cereals. Avoid all processed foods. Meats and other proteins Fatty cuts of meat. Poultry with skin. Breaded or fried meats. Processed meat. Avoid saturated fats. Dairy Full-fat yogurt, cheese, or milk. Beverages Sweetened drinks, such as soda or iced tea. The items listed above may not be a complete list of foods and beverages you should avoid. Contact a dietitian for more information. Questions to ask a health care provider  Do I need to meet with a diabetes educator?  Do I need to meet with a dietitian?  What number can I call if I have questions?  When are the best times to check my blood glucose? Where to find more information:  American Diabetes Association: diabetes.org  Academy of Nutrition and Dietetics: www.eatright.org  National Institute of Diabetes and Digestive and Kidney Diseases: www.niddk.nih.gov  Association of Diabetes Care and Education Specialists: www.diabeteseducator.org Summary  It is important to have healthy eating   habits because your blood sugar (glucose) levels are greatly affected by what you eat and drink.  A healthy meal plan will help you control your blood glucose and maintain a healthy lifestyle.  Your health care provider may recommend that you work with a dietitian to make a meal plan that is best for you.  Keep in mind that carbohydrates (carbs) and alcohol have immediate effects on your blood glucose levels. It is important to count carbs and to use alcohol carefully. This information is not intended to replace advice given to you by your health care provider. Make sure you discuss any questions you have with your health care provider. Document Revised: 12/31/2018 Document Reviewed: 12/31/2018 Elsevier Patient Education  2021 [place] [employer]  Hypertension Hypertension, also called high blood pressure, is when the force of the blood pressing against the walls of the arteries is too strong. Arteries are blood vessels that carry blood from your heart throughout your body. Hypertension forces the heart to work harder to pump blood and may cause the arteries to become narrow or stiff. Understanding blood pressure readings Your personal target blood pressure may vary depending on your medical conditions, your age, and other factors. A blood pressure reading includes a higher number over a lower number. Ideally, your blood pressure should be below 120/80. You should know that:  The first, or top, number is called the systolic pressure. It is a measure of the pressure in your arteries as your heart beats.  The second, or bottom number, is called the diastolic pressure. It is a measure of the pressure in your arteries as the heart relaxes. Blood pressure is classified into four stages. Based on your blood pressure reading, your health care provider may use the following stages to determine what type of treatment you need, if any. Systolic pressure and diastolic pressure are measured in a unit called mmHg. Normal  Systolic pressure: below 778.  Diastolic pressure: below 80. Elevated  Systolic pressure: 242-353.  Diastolic pressure: below 80. Hypertension stage 1  Systolic pressure: 614-431.  Diastolic pressure: 54-00. Hypertension stage 2  Systolic pressure: 867 or above.  Diastolic pressure: 90 or above. How can this condition affect me? Managing your hypertension is an important responsibility. Over time, hypertension can damage the arteries and decrease blood flow to important parts of the body, including the brain, heart, and kidneys. Having untreated or uncontrolled hypertension can lead to:  A heart attack.  A stroke.  A weakened blood vessel (aneurysm).  Heart failure.  Kidney damage.  Eye damage.  Metabolic  syndrome.  Memory and concentration problems.  Vascular dementia. What actions can I take to manage this condition? Hypertension can be managed by making lifestyle changes and possibly by taking medicines. Your health care provider will help you make a plan to bring your blood pressure within a normal range. Nutrition  Eat a diet that is high in fiber and potassium, and low in salt (sodium), added sugar, and fat. An example eating plan is called the Dietary Approaches to Stop Hypertension (DASH) diet. To eat this way: ? Eat plenty of fresh fruits and vegetables. Try to fill one-half of your plate at each meal with fruits and vegetables. ? Eat whole grains, such as whole-wheat pasta, brown rice, or whole-grain bread. Fill about one-fourth of your plate with whole grains. ? Eat low-fat dairy products. ? Avoid fatty cuts of meat, processed or cured meats, and poultry with skin. Fill about one-fourth of your plate with  lean proteins such as fish, chicken without skin, beans, eggs, and tofu. ? Avoid pre-made and processed foods. These tend to be higher in sodium, added sugar, and fat.  Reduce your daily sodium intake. Most people with hypertension should eat less than 1,500 mg of sodium a day.   Lifestyle  Work with your health care provider to maintain a healthy body weight or to lose weight. Ask what an ideal weight is for you.  Get at least 30 minutes of exercise that causes your heart to beat faster (aerobic exercise) most days of the week. Activities may include walking, swimming, or biking.  Include exercise to strengthen your muscles (resistance exercise), such as weight lifting, as part of your weekly exercise routine. Try to do these types of exercises for 30 minutes at least 3 days a week.  Do not use any products that contain nicotine or tobacco, such as cigarettes, e-cigarettes, and chewing tobacco. If you need help quitting, ask your health care provider.  Control any long-term  (chronic) conditions you have, such as high cholesterol or diabetes.  Identify your sources of stress and find ways to manage stress. This may include meditation, deep breathing, or making time for fun activities.   Alcohol use  Do not drink alcohol if: ? Your health care provider tells you not to drink. ? You are pregnant, may be pregnant, or are planning to become pregnant.  If you drink alcohol: ? Limit how much you use to:  0-1 drink a day for women.  0-2 drinks a day for men. ? Be aware of how much alcohol is in your drink. In the [place] one drink equals one 12 oz bottle of beer (355 mL), one 5 oz glass of wine (148 mL), or one 1 oz glass of hard liquor (44 mL). Medicines Your health care provider may prescribe medicine if lifestyle changes are not enough to get your blood pressure under control and if:  Your systolic blood pressure is 130 or higher.  Your diastolic blood pressure is 80 or higher. Take medicines only as told by your health care provider. Follow the directions carefully. Blood pressure medicines must be taken as told by your health care provider. The medicine does not work as well when you skip doses. Skipping doses also puts you at risk for problems. Monitoring Before you monitor your blood pressure:  Do not smoke, drink caffeinated beverages, or exercise within 30 minutes before taking a measurement.  Use the bathroom and empty your bladder (urinate).  Sit quietly for at least 5 minutes before taking measurements. Monitor your blood pressure at home as told by your health care provider. To do this:  Sit with your back straight and supported.  Place your feet flat on the floor. Do not cross your legs.  Support your arm on a flat surface, such as a table. Make sure your upper arm is at heart level.  Each time you measure, take two or three readings one minute apart and record the results. You may also need to have your blood pressure checked regularly by  your health care provider.   General information  Talk with your health care provider about your diet, exercise habits, and other lifestyle factors that may be contributing to hypertension.  Review all the medicines you take with your health care provider because there may be side effects or interactions.  Keep all visits as told by your health care provider. Your health care provider can help you create and  adjust your plan for managing your high blood pressure. Where to find more information  National Heart, Lung, and Blood Institute: https://wilson-eaton.com/  American Heart Association: www.heart.org Contact a health care provider if:  You think you are having a reaction to medicines you have taken.  You have repeated (recurrent) headaches.  You feel dizzy.  You have swelling in your ankles.  You have trouble with your vision. Get help right away if:  You develop a severe headache or confusion.  You have unusual weakness or numbness, or you feel faint.  You have severe pain in your chest or abdomen.  You vomit repeatedly.  You have trouble breathing. These symptoms may represent a serious problem that is an emergency. Do not wait to see if the symptoms will go away. Get medical help right away. Call your local emergency services (911 in the [place] Do not drive yourself to the hospital. Summary  Hypertension is when the force of blood pumping through your arteries is too strong. If this condition is not controlled, it may put you at risk for serious complications.  Your personal target blood pressure may vary depending on your medical conditions, your age, and other factors. For most people, a normal blood pressure is less than 120/80.  Hypertension is managed by lifestyle changes, medicines, or both.  Lifestyle changes to help manage hypertension include losing weight, eating a healthy, low-sodium diet, exercising more, stopping smoking, and limiting alcohol. This information  is not intended to replace advice given to you by your health care provider. Make sure you discuss any questions you have with your health care provider. Document Revised: 02/28/2019 Document Reviewed: 12/24/2018 Elsevier Patient Education  2021 [place] [employer] Calories are units of energy. Your body needs a certain number of calories from food to keep going throughout the day. When you eat or drink more calories than your body needs, your body stores the extra calories mostly as fat. When you eat or drink fewer calories than your body needs, your body burns fat to get the energy it needs. Calorie counting means keeping track of how many calories you eat and drink each day. Calorie counting can be helpful if you need to lose weight. If you eat fewer calories than your body needs, you should lose weight. Ask your health care provider what a healthy weight is for you. For calorie counting to work, you will need to eat the right number of calories each day to lose a healthy amount of weight per week. A dietitian can help you figure out how many calories you need in a day and will suggest ways to reach your calorie goal.  A healthy amount of weight to lose each week is usually 1-2 lb (0.5-0.9 kg). This usually means that your daily calorie intake should be reduced by 500-750 calories.  Eating 1,200-1,500 calories a day can help most women lose weight.  Eating 1,500-1,800 calories a day can help most men lose weight. What do I need to know about calorie counting? Work with your health care provider or dietitian to determine how many calories you should get each day. To meet your daily calorie goal, you will need to:  Find out how many calories are in each food that you would like to eat. Try to do this before you eat.  Decide how much of the food you plan to eat.  Keep a food log. Do this by writing down what you ate and  how many calories it had. To successfully  lose weight, it is important to balance calorie counting with a healthy lifestyle that includes regular activity. Where do I find calorie information? The number of calories in a food can be found on a Nutrition Facts label. If a food does not have a Nutrition Facts label, try to look up the calories online or ask your dietitian for help. Remember that calories are listed per serving. If you choose to have more than one serving of a food, you will have to multiply the calories per serving by the number of servings you plan to eat. For example, the label on a package of bread might say that a serving size is 1 slice and that there are 90 calories in a serving. If you eat 1 slice, you will have eaten 90 calories. If you eat 2 slices, you will have eaten 180 calories.   How do I keep a food log? After each time that you eat, record the following in your food log as soon as possible:  What you ate. Be sure to include toppings, sauces, and other extras on the food.  How much you ate. This can be measured in cups, ounces, or number of items.  How many calories were in each food and drink.  The total number of calories in the food you ate. Keep your food log near you, such as in a pocket-sized notebook or on an app or website on your mobile phone. Some programs will calculate calories for you and show you how many calories you have left to meet your daily goal. What are some portion-control tips?  Know how many calories are in a serving. This will help you know how many servings you can have of a certain food.  Use a measuring cup to measure serving sizes. You could also try weighing out portions on a kitchen scale. With time, you will be able to estimate serving sizes for some foods.  Take time to put servings of different foods on your favorite plates or in your favorite bowls and cups so you know what a serving looks like.  Try not to eat straight from a food's packaging, such as from a bag or  box. Eating straight from the package makes it hard to see how much you are eating and can lead to overeating. Put the amount you would like to eat in a cup or on a plate to make sure you are eating the right portion.  Use smaller plates, glasses, and bowls for smaller portions and to prevent overeating.  Try not to multitask. For example, avoid watching TV or using your computer while eating. If it is time to eat, sit down at a table and enjoy your food. This will help you recognize when you are full. It will also help you be more mindful of what and how much you are eating. What are tips for following this plan? Reading food labels  Check the calorie count compared with the serving size. The serving size may be smaller than what you are used to eating.  Check the source of the calories. Try to choose foods that are high in protein, fiber, and vitamins, and low in saturated fat, trans fat, and sodium. Shopping  Read nutrition labels while you shop. This will help you make healthy decisions about which foods to buy.  Pay attention to nutrition labels for low-fat or fat-free foods. These foods sometimes have the same number of calories  or more calories than the full-fat versions. They also often have added sugar, starch, or salt to make up for flavor that was removed with the fat.  Make a grocery list of lower-calorie foods and stick to it. Cooking  Try to cook your favorite foods in a healthier way. For example, try baking instead of frying.  Use low-fat dairy products. Meal planning  Use more fruits and vegetables. One-half of your plate should be fruits and vegetables.  Include lean proteins, such as chicken, [place], and fish. Lifestyle Each week, aim to do one of the following:  150 minutes of moderate exercise, such as walking.  75 minutes of vigorous exercise, such as running. General information  Know how many calories are in the foods you eat most often. This will help you  calculate calorie counts faster.  Find a way of tracking calories that works for you. Get creative. Try different apps or programs if writing down calories does not work for you. What foods should I eat?  Eat nutritious foods. It is better to have a nutritious, high-calorie food, such as an avocado, than a food with few nutrients, such as a bag of potato chips.  Use your calories on foods and drinks that will fill you up and will not leave you hungry soon after eating. ? Examples of foods that fill you up are nuts and nut butters, vegetables, lean proteins, and high-fiber foods such as whole grains. High-fiber foods are foods with more than 5 g of fiber per serving.  Pay attention to calories in drinks. Low-calorie drinks include water and unsweetened drinks. The items listed above may not be a complete list of foods and beverages you can eat. Contact a dietitian for more information.   What foods should I limit? Limit foods or drinks that are not good sources of vitamins, minerals, or protein or that are high in unhealthy fats. These include:  Candy.  Other sweets.  Sodas, specialty coffee drinks, alcohol, and juice. The items listed above may not be a complete list of foods and beverages you should avoid. Contact a dietitian for more information. How do I count calories when eating out?  Pay attention to portions. Often, portions are much larger when eating out. Try these tips to keep portions smaller: ? Consider sharing a meal instead of getting your own. ? If you get your own meal, eat only half of it. Before you start eating, ask for a container and put half of your meal into it. ? When available, consider ordering smaller portions from the menu instead of full portions.  Pay attention to your food and drink choices. Knowing the way food is cooked and what is included with the meal can help you eat fewer calories. ? If calories are listed on the menu, choose the lower-calorie  options. ? Choose dishes that include vegetables, fruits, whole grains, low-fat dairy products, and lean proteins. ? Choose items that are boiled, broiled, grilled, or steamed. Avoid items that are buttered, battered, fried, or served with cream sauce. Items labeled as crispy are usually fried, unless stated otherwise. ? Choose water, low-fat milk, unsweetened iced tea, or other drinks without added sugar. If you want an alcoholic beverage, choose a lower-calorie option, such as a glass of wine or light beer. ? Ask for dressings, sauces, and syrups on the side. These are usually high in calories, so you should limit the amount you eat. ? If you want a salad, choose a garden salad  and ask for grilled meats. Avoid extra toppings such as bacon, cheese, or fried items. Ask for the dressing on the side, or ask for olive oil and vinegar or lemon to use as dressing.  Estimate how many servings of a food you are given. Knowing serving sizes will help you be aware of how much food you are eating at restaurants. Where to find more information  Centers for Disease Control and Prevention: http://www.wolf.info/  [place] Department of Agriculture: http://www.wilson-mendoza.org/ Summary  Calorie counting means keeping track of how many calories you eat and drink each day. If you eat fewer calories than your body needs, you should lose weight.  A healthy amount of weight to lose per week is usually 1-2 lb (0.5-0.9 kg). This usually means reducing your daily calorie intake by 500-750 calories.  The number of calories in a food can be found on a Nutrition Facts label. If a food does not have a Nutrition Facts label, try to look up the calories online or ask your dietitian for help.  Use smaller plates, glasses, and bowls for smaller portions and to prevent overeating.  Use your calories on foods and drinks that will fill you up and not leave you hungry shortly after a meal. This information is not intended to replace advice given to you by  your health care provider. Make sure you discuss any questions you have with your health care provider. Document Revised: 03/06/2019 Document Reviewed: 03/06/2019 Elsevier Patient Education  2021 [place] and Cholesterol Restricted Eating Plan Getting too much fat and cholesterol in your diet may cause health problems. Choosing the right foods helps keep your fat and cholesterol at normal levels. This can keep you from getting certain diseases. Your doctor may recommend an eating plan that includes:  Total fat: ______% or less of total calories a day.  Saturated fat: ______% or less of total calories a day.  Cholesterol: less than _________mg a day.  Fiber: ______g a day. What are tips for following this plan? Meal planning  At meals, divide your plate into four equal parts: ? Fill one-half of your plate with vegetables and green salads. ? Fill one-fourth of your plate with whole grains. ? Fill one-fourth of your plate with low-fat (lean) protein foods.  Eat fish that is high in omega-3 fats at least two times a week. This includes mackerel, tuna, sardines, and salmon.  Eat foods that are high in fiber, such as whole grains, beans, apples, broccoli, carrots, peas, and barley. General tips  Work with your doctor to lose weight if you need to.  Avoid: ? Foods with added sugar. ? Fried foods. ? Foods with partially hydrogenated oils.  Limit alcohol intake to no more than 1 drink a day for nonpregnant women and 2 drinks a day for men. One drink equals 12 oz of beer, 5 oz of wine, or 1 oz of hard liquor.   Reading food labels  Check food labels for: ? Trans fats. ? Partially hydrogenated oils. ? Saturated fat (g) in each serving. ? Cholesterol (mg) in each serving. ? Fiber (g) in each serving.  Choose foods with healthy fats, such as: ? Monounsaturated fats. ? Polyunsaturated fats. ? Omega-3 fats.  Choose grain products that have whole grains. Look for the  word "whole" as the first word in the ingredient list. Cooking  Cook foods using low-fat methods. These include baking, boiling, grilling, and broiling.  Eat more home-cooked foods. Eat at restaurants and  buffets less often.  Avoid cooking using saturated fats, such as butter, cream, palm oil, palm kernel oil, and coconut oil. Recommended foods Fruits  All fresh, canned (in natural juice), or frozen fruits. Vegetables  Fresh or frozen vegetables (raw, steamed, roasted, or grilled). Green salads. Grains  Whole grains, such as whole wheat or whole grain breads, crackers, cereals, and pasta. Unsweetened oatmeal, bulgur, barley, quinoa, or brown rice. Corn or whole wheat flour tortillas. Meats and other protein foods  Ground beef (85% or leaner), grass-fed beef, or beef trimmed of fat. Skinless chicken or [place]. Ground chicken or [place]. Pork trimmed of fat. All fish and seafood. Egg whites. Dried beans, peas, or lentils. Unsalted nuts or seeds. Unsalted canned beans. Nut butters without added sugar or oil. Dairy  Low-fat or nonfat dairy products, such as skim or 1% milk, 2% or reduced-fat cheeses, low-fat and fat-free ricotta or cottage cheese, or plain low-fat and nonfat yogurt. Fats and oils  Tub margarine without trans fats. Light or reduced-fat mayonnaise and salad dressings. Avocado. Olive, canola, sesame, or safflower oils. The items listed above may not be a complete list of foods and beverages you can eat. Contact a dietitian for more information.   Foods to avoid Fruits  Canned fruit in heavy syrup. Fruit in cream or butter sauce. Fried fruit. Vegetables  Vegetables cooked in cheese, cream, or butter sauce. Fried vegetables. Grains  White bread. White pasta. White rice. Cornbread. Bagels, pastries, and croissants. Crackers and snack foods that contain trans fat and hydrogenated oils. Meats and other protein foods  Fatty cuts of meat. Ribs, chicken wings, bacon, sausage,  bologna, salami, chitterlings, fatback, hot dogs, bratwurst, and packaged lunch meats. Liver and organ meats. Whole eggs and egg yolks. Chicken and [place] with skin. Fried meat. Dairy  Whole or 2% milk, cream, half-and-half, and cream cheese. Whole milk cheeses. Whole-fat or sweetened yogurt. Full-fat cheeses. Nondairy creamers and whipped toppings. Processed cheese, cheese spreads, and cheese curds. Beverages  Alcohol. Sugar-sweetened drinks such as sodas, lemonade, and fruit drinks. Fats and oils  Butter, stick margarine, lard, shortening, ghee, or bacon fat. Coconut, palm kernel, and palm oils. Sweets and desserts  Corn syrup, sugars, honey, and molasses. Candy. Jam and jelly. Syrup. Sweetened cereals. Cookies, pies, cakes, donuts, muffins, and ice cream. The items listed above may not be a complete list of foods and beverages you should avoid. Contact a dietitian for more information. Summary  Choosing the right foods helps keep your fat and cholesterol at normal levels. This can keep you from getting certain diseases.  At meals, fill one-half of your plate with vegetables and green salads.  Eat high-fiber foods, like whole grains, beans, apples, carrots, peas, and barley.  Limit added sugar, saturated fats, alcohol, and fried foods. This information is not intended to replace advice given to you by your health care provider. Make sure you discuss any questions you have with your health care provider. Document Revised: 05/28/2019 Document Reviewed: 05/28/2019 Elsevier Patient Education  2021 [employer].

## 2020-06-17 LAB — LIPID PANEL W/O CHOL/HDL RATIO
Cholesterol, Total: 124 mg/dL (ref 100–199)
HDL: 52 mg/dL (ref >39)
LDL Chol Calc (NIH): 56 mg/dL (ref 0–99)
Triglycerides: 79 mg/dL (ref 0–149)
VLDL Cholesterol Cal: 16 mg/dL (ref 5–40)

## 2020-06-17 NOTE — Addendum Note (Signed)
Addended by: Neta Ehlers on: 06/17/2020 11:18 AM   Modules accepted: Orders

## 2020-06-18 ENCOUNTER — Other Ambulatory Visit: Payer: Self-pay

## 2020-06-18 ENCOUNTER — Other Ambulatory Visit: Payer: Self-pay | Admitting: Obstetrics & Gynecology

## 2020-06-18 ENCOUNTER — Ambulatory Visit
Admission: RE | Admit: 2020-06-18 | Discharge: 2020-06-18 | Disposition: A | Discharge status: Home / Self Care | Payer: 59 | Source: Ambulatory Visit | Attending: Obstetrics & Gynecology | Admitting: Obstetrics & Gynecology

## 2020-06-18 DIAGNOSIS — D219 Benign neoplasm of connective and other soft tissue, unspecified: Secondary | ICD-10-CM | POA: Diagnosis present

## 2020-06-21 ENCOUNTER — Encounter: Payer: Self-pay | Admitting: Obstetrics & Gynecology

## 2020-06-21 ENCOUNTER — Ambulatory Visit (INDEPENDENT_AMBULATORY_CARE_PROVIDER_SITE_OTHER): Payer: 59 | Admitting: Obstetrics & Gynecology

## 2020-06-21 ENCOUNTER — Other Ambulatory Visit: Payer: Self-pay | Admitting: Adult Health

## 2020-06-21 ENCOUNTER — Other Ambulatory Visit: Payer: Self-pay

## 2020-06-21 VITALS — BP 142/80 | Ht 59.0 in | Wt 265.0 lb

## 2020-06-21 DIAGNOSIS — E119 Type 2 diabetes mellitus without complications: Secondary | ICD-10-CM

## 2020-06-21 DIAGNOSIS — R350 Frequency of micturition: Secondary | ICD-10-CM

## 2020-06-21 DIAGNOSIS — D219 Benign neoplasm of connective and other soft tissue, unspecified: Secondary | ICD-10-CM | POA: Diagnosis not present

## 2020-06-21 NOTE — Progress Notes (Signed)
Labs stable, her hemoglobin A1C is trending down and her glucose from last check. Advise she can continue current medication and continue to work on diet and exercise. She needs recheck Hemoglobin A1c and CMP in 3 months scheduled.  Cholesterol within normal .  TSH for thyroid within normal limits.  Vitamin D ok but low end normal  may take Vitamin d at 2,000 mcg once daily by mouth. Over the counter.  CBC ok.

## 2020-06-21 NOTE — Progress Notes (Signed)
HPI: Uterine Fibroids Patient presents with known uterine fibroids for years. Bleeding has lessened these past 3 mos (she had eiposde of heavy bleeding in Jan).  No real pain ,but she has bladder pressure and frequency often.  No intermenstrual or other bleeding, spotting, or discharge.  Ultrasound demonstrates persistence of fibroids w dominant fibroid 17 cm (up from 16.5 last yr)  PMHx: She  has a past medical history of Allergy, Anemia, Anxiety, Arthritis, Diabetes mellitus without complication ([place]), Fibroids, Heart murmur, and Hypertension. Also,  has a past surgical history that includes Ventral hernia repair (N/A, 08/22/2018); Bowel resection (N/A, 08/22/2018); and Breast excisional biopsy (Left, 1995)., family history includes Cancer in her paternal aunt; Cirrhosis in her maternal grandmother; Endometrial cancer in her sister; Fibroids in her sister; Heart attack in her paternal uncle; Prostate cancer in her father; Uterine cancer (age of onset: 56) in her maternal aunt.,  reports that she quit smoking about 33 years ago. She has never used smokeless tobacco. She reports that she does not use drugs.  She has a current medication list which includes the following prescription(s): amlodipine, atorvastatin, blood glucose meter kit and supplies, empagliflozin, fluticasone, onetouch delica plus [id number], metoprolol succinate, onetouch ultra, and triamterene-hydrochlorothiazide. Also, is allergic to ace inhibitors and codeine.  Review of Systems  Genitourinary: Positive for frequency.  All other systems reviewed and are negative.   Objective: BP (!) 142/80   Ht _0  (1.499 m)   Wt 265 lb (120.2 kg)   BMI 53.52 kg/m   Physical examination Constitutional NAD, Conversant  Skin No rashes, lesions or ulceration.   Extremities: Moves all appropriately.  Normal ROM for age. No lymphadenopathy.  Neuro: Grossly intact  Psych: Oriented to PPT.  Normal mood. Normal affect.   TRANSABDOMINAL  ULTRASOUND OF PELVIS TECHNIQUE: Transabdominal ultrasound examination of the pelvis was performed including evaluation of the uterus, ovaries, adnexal regions, and pelvic cul-de-sac.  COMPARISON:  Prior ultrasound from 10/29/2019  FINDINGS: Uterus  Measurements: 21.0 x 13.7 x 20.3 cm = volume: 3058.7 mL. Uterus is anteverted. Large intramural fibroid positioned at the central uterus measures 17.0 x 14.6 x 16.7 cm. Probable additional partially calcified intramural fibroid measuring 2.6 x 3.6 x 1.0 cm present at the left lower uterine segment. Possible additional fibroids are suspected, difficult to visualize and measure discretely on this transabdominal only exam.  Endometrium  Obscured by overlying fibroid.  Right ovary  Not visualized.  No adnexal mass.  Left ovary  Not visualized.  No adnexal mass.  Other findings:  No abnormal free fluid.  IMPRESSION: 1. Enlarged fibroid uterus as above, with dominant 17 cm intramural fibroid occupying the central uterus. 2. Nonvisualization of the endometrium, obscured by overlying fibroid. 3. Nonvisualization of either ovary. No adnexal mass or free fluid within the pelvis.   Assessment:  Leiomyoma Irreg uterine bleeding    (improved these past 3 mos Urinary frequency    Daily  Options discussed Cannot rule out cancer without biopsy, thus D&C as one option (unable to perform EMB based on exam in office).  Low likelihood based on long history of fibroids and slow growth noted on last 2 US's. Hysterectomy as option for treatment and resolution of sx's related to fibroids and removal of cancer risk.  A total of 23 minutes were spent face-to-face with the patient regarding fibroid prognosis and treatment options as well as preparation, review, communication, and documentation during this encounter.   Barnett Applebaum, MD, [facility], [place]  06/21/2020  9:18 AM

## 2020-06-22 ENCOUNTER — Telehealth: Payer: Self-pay | Admitting: Adult Health

## 2020-06-22 NOTE — Telephone Encounter (Signed)
Patient called to get her lab results from last week.

## 2020-06-23 NOTE — Telephone Encounter (Signed)
See result note.  

## 2020-09-15 ENCOUNTER — Ambulatory Visit: Payer: Self-pay | Admitting: Adult Health

## 2020-09-27 ENCOUNTER — Other Ambulatory Visit: Payer: Self-pay

## 2020-09-27 ENCOUNTER — Other Ambulatory Visit: Payer: Self-pay | Admitting: Adult Health

## 2020-09-27 ENCOUNTER — Other Ambulatory Visit (INDEPENDENT_AMBULATORY_CARE_PROVIDER_SITE_OTHER): Payer: 59

## 2020-09-27 DIAGNOSIS — I152 Hypertension secondary to endocrine disorders: Secondary | ICD-10-CM

## 2020-09-27 DIAGNOSIS — E1159 Type 2 diabetes mellitus with other circulatory complications: Secondary | ICD-10-CM

## 2020-09-27 DIAGNOSIS — E119 Type 2 diabetes mellitus without complications: Secondary | ICD-10-CM

## 2020-09-27 LAB — COMPREHENSIVE METABOLIC PANEL
ALT: 8 U/L (ref 0–35)
AST: 11 U/L (ref 0–37)
Albumin: 4 g/dL (ref 3.5–5.2)
Alkaline Phosphatase: 79 U/L (ref 39–117)
BUN: 14 mg/dL (ref 6–23)
CO2: 31 mEq/L (ref 19–32)
Calcium: 9.9 mg/dL (ref 8.4–10.5)
Chloride: 98 mEq/L (ref 96–112)
Creatinine, Ser: 0.86 mg/dL (ref 0.40–1.20)
GFR: 72.64 mL/min (ref >60.00)
Glucose, Bld: 131 mg/dL — ABNORMAL HIGH (ref 70–99)
Potassium: 3.8 mEq/L (ref 3.5–5.1)
Sodium: 138 mEq/L (ref 135–145)
Total Bilirubin: 0.5 mg/dL (ref 0.2–1.2)
Total Protein: 7.5 g/dL (ref 6.0–8.3)

## 2020-09-27 LAB — MICROALBUMIN / CREATININE URINE RATIO
Creatinine,U: 75.4 mg/dL
Microalb Creat Ratio: 16.3 mg/g (ref 0.0–30.0)
Microalb, Ur: 12.3 mg/dL — ABNORMAL HIGH (ref 0.0–1.9)

## 2020-09-27 LAB — HEMOGLOBIN A1C: Hgb A1c MFr Bld: 7.1 % — ABNORMAL HIGH (ref 4.6–6.5)

## 2020-12-20 ENCOUNTER — Ambulatory Visit: Payer: 59 | Admitting: Adult Health

## 2020-12-22 ENCOUNTER — Other Ambulatory Visit: Payer: Self-pay | Admitting: Adult Health

## 2020-12-22 DIAGNOSIS — I152 Hypertension secondary to endocrine disorders: Secondary | ICD-10-CM

## 2020-12-22 DIAGNOSIS — E1159 Type 2 diabetes mellitus with other circulatory complications: Secondary | ICD-10-CM

## 2020-12-23 ENCOUNTER — Encounter: Payer: Self-pay | Admitting: Obstetrics & Gynecology

## 2020-12-23 ENCOUNTER — Telehealth: Payer: 59 | Admitting: Obstetrics & Gynecology

## 2020-12-23 ENCOUNTER — Other Ambulatory Visit: Payer: Self-pay

## 2020-12-23 VITALS — BP 148/98 | Ht 59.0 in | Wt 262.0 lb

## 2020-12-23 DIAGNOSIS — D219 Benign neoplasm of connective and other soft tissue, unspecified: Secondary | ICD-10-CM | POA: Diagnosis not present

## 2020-12-23 DIAGNOSIS — R350 Frequency of micturition: Secondary | ICD-10-CM | POA: Diagnosis not present

## 2020-12-23 DIAGNOSIS — N921 Excessive and frequent menstruation with irregular cycle: Secondary | ICD-10-CM

## 2020-12-23 NOTE — Progress Notes (Signed)
  Uterine Fibroids Follow Up Patient is a 60 yo G2P2 who presents with known uterine fibroids. Periods are irreg, light and spotty, but still monthly to a degree.  No heavy bleeding. Dysmenorrhea:mild crampiness. Other symptoms include  urinary freq and incontinence. . No intermenstrual bleeding, spotting, or discharge.Pt is now thinking she would like treatment due to the aggravation of bleeding, urinary sx's.  PMHx: She  has a past medical history of Allergy, Anemia, Anxiety, Arthritis, Diabetes mellitus without complication ([place]), Fibroids, Heart murmur, and Hypertension. Also,  has a past surgical history that includes Ventral hernia repair (N/A, 08/22/2018); Bowel resection (N/A, 08/22/2018); and Breast excisional biopsy (Left, 1995)., family history includes Cancer in her paternal aunt; Cirrhosis in her maternal grandmother; Endometrial cancer in her sister; Fibroids in her sister; Heart attack in her paternal uncle; Prostate cancer in her father; Uterine cancer (age of onset: 34) in her maternal aunt.,  reports that she quit smoking about 34 years ago. Her smoking use included cigarettes. She has never used smokeless tobacco. She reports that she does not use drugs.  She has a current medication list which includes the following prescription(s): amlodipine, atorvastatin, blood glucose meter kit and supplies, empagliflozin, fluticasone, onetouch delica plus [id number], metoprolol succinate, onetouch ultra, and triamterene-hydrochlorothiazide. Also, is allergic to ace inhibitors and codeine.  Review of Systems  Genitourinary:  Positive for frequency.  All other systems reviewed and are negative.  Objective: BP (!) 148/98   Ht _0  (1.499 m)   Wt 262 lb (118.8 kg)   BMI 52.92 kg/m  Physical Exam Constitutional:      General: She is not in acute distress.    Appearance: She is well-developed.  Musculoskeletal:        General: Normal range of motion.  Neurological:     Mental Status: She is  alert and oriented to person, place, and time.  Skin:    General: Skin is warm and dry.  Vitals reviewed.    ASSESSMENT/PLAN:   1. Leiomyoma 2. Metrorrhagia 3. Urinary frequency - US PELVIC COMPLETE WITH TRANSVAGINAL; Future  Fibroid treatment such as [place], Lupron, Myomectomy, and Hysterectomy discussed in detail, with the pros and cons of each choice counseled.  No treatment as an option also discussed, as well as control of symptoms alone with hormone therapy. Information provided to the patient.  Info provided to the patient, and the benefits of [place] discussed. Also, referral for robotics or minimally invasive surgery as an option for planning surgery. Pt feels she would like [facility] here for her sx's related to fibroids.  If significant decrease in fibroid size/volume, there may also be benefit in waiting.   Barnett Applebaum, MD, [facility], [facility] 12/23/2020  9:55 AM

## 2020-12-29 ENCOUNTER — Ambulatory Visit: Payer: 59

## 2021-01-18 ENCOUNTER — Telehealth: Payer: 59 | Admitting: Obstetrics & Gynecology

## 2021-02-01 ENCOUNTER — Ambulatory Visit: Payer: 59 | Admitting: Adult Health

## 2021-02-01 ENCOUNTER — Other Ambulatory Visit: Payer: Self-pay

## 2021-02-01 ENCOUNTER — Encounter: Payer: Self-pay | Admitting: Adult Health

## 2021-02-01 VITALS — BP 128/82 | HR 96 | Resp 17 | Ht 59.02 in | Wt 262.6 lb

## 2021-02-01 DIAGNOSIS — J301 Allergic rhinitis due to pollen: Secondary | ICD-10-CM | POA: Diagnosis not present

## 2021-02-01 DIAGNOSIS — E785 Hyperlipidemia, unspecified: Secondary | ICD-10-CM

## 2021-02-01 DIAGNOSIS — I152 Hypertension secondary to endocrine disorders: Secondary | ICD-10-CM

## 2021-02-01 DIAGNOSIS — I10 Essential (primary) hypertension: Secondary | ICD-10-CM

## 2021-02-01 DIAGNOSIS — E119 Type 2 diabetes mellitus without complications: Secondary | ICD-10-CM | POA: Diagnosis not present

## 2021-02-01 DIAGNOSIS — E1159 Type 2 diabetes mellitus with other circulatory complications: Secondary | ICD-10-CM

## 2021-02-01 LAB — CBC WITH DIFFERENTIAL/PLATELET
Basophils Absolute: 0 10*3/uL (ref 0.0–0.1)
Basophils Relative: 0.6 % (ref 0.0–3.0)
Eosinophils Absolute: 0.2 10*3/uL (ref 0.0–0.7)
Eosinophils Relative: 2.9 % (ref 0.0–5.0)
HCT: 44.1 % (ref 36.0–46.0)
Hemoglobin: 14.2 g/dL (ref 12.0–15.0)
Lymphocytes Relative: 13.9 % (ref 12.0–46.0)
Lymphs Abs: 1 10*3/uL (ref 0.7–4.0)
MCHC: 32.2 g/dL (ref 30.0–36.0)
MCV: 82.2 fl (ref 78.0–100.0)
Monocytes Absolute: 0.4 10*3/uL (ref 0.1–1.0)
Monocytes Relative: 5.4 % (ref 3.0–12.0)
Neutro Abs: 5.7 10*3/uL (ref 1.4–7.7)
Neutrophils Relative %: 77.2 % — ABNORMAL HIGH (ref 43.0–77.0)
Platelets: 305 10*3/uL (ref 150.0–400.0)
RBC: 5.37 Mil/uL — ABNORMAL HIGH (ref 3.87–5.11)
RDW: 18.1 % — ABNORMAL HIGH (ref 11.5–15.5)
WBC: 7.4 10*3/uL (ref 4.0–10.5)

## 2021-02-01 LAB — COMPREHENSIVE METABOLIC PANEL
ALT: 8 U/L (ref 0–35)
AST: 10 U/L (ref 0–37)
Albumin: 4 g/dL (ref 3.5–5.2)
Alkaline Phosphatase: 73 U/L (ref 39–117)
BUN: 16 mg/dL (ref 6–23)
CO2: 29 mEq/L (ref 19–32)
Calcium: 10 mg/dL (ref 8.4–10.5)
Chloride: 99 mEq/L (ref 96–112)
Creatinine, Ser: 0.83 mg/dL (ref 0.40–1.20)
GFR: 75.62 mL/min (ref >60.00)
Glucose, Bld: 104 mg/dL — ABNORMAL HIGH (ref 70–99)
Potassium: 3.9 mEq/L (ref 3.5–5.1)
Sodium: 138 mEq/L (ref 135–145)
Total Bilirubin: 0.5 mg/dL (ref 0.2–1.2)
Total Protein: 7.2 g/dL (ref 6.0–8.3)

## 2021-02-01 LAB — TSH: TSH: 2.39 u[IU]/mL (ref 0.35–5.50)

## 2021-02-01 LAB — HEMOGLOBIN A1C: Hgb A1c MFr Bld: 7.2 % — ABNORMAL HIGH (ref 4.6–6.5)

## 2021-02-01 MED ORDER — TRIAMTERENE-HCTZ 37.5-25 MG PO TABS: 1.0000 | ORAL_TABLET | Freq: Every day | ORAL | 1 refills | Status: DC

## 2021-02-01 MED ORDER — FLUTICASONE PROPIONATE 50 MCG/ACT NA SUSP: 1.0000 | Freq: Every day | NASAL | 4 refills | Status: DC

## 2021-02-01 MED ORDER — ATORVASTATIN CALCIUM 10 MG PO TABS: 10.0000 mg | ORAL_TABLET | Freq: Every day | ORAL | 2 refills | Status: DC

## 2021-02-01 MED ORDER — AMLODIPINE BESYLATE 10 MG PO TABS: 10.0000 mg | ORAL_TABLET | Freq: Every day | ORAL | 2 refills | Status: DC

## 2021-02-01 MED ORDER — EMPAGLIFLOZIN 10 MG PO TABS: 10.0000 mg | ORAL_TABLET | Freq: Every day | ORAL | 2 refills | Status: DC

## 2021-02-01 MED ORDER — ONETOUCH DELICA PLUS LANCET30G MISC: 1 refills | Status: DC

## 2021-02-01 MED ORDER — BLOOD GLUCOSE METER KIT: PACK | 1 refills | Status: DC

## 2021-02-01 MED ORDER — ONETOUCH ULTRA VI STRP: ORAL_STRIP | 1 refills | Status: DC

## 2021-02-01 NOTE — Progress Notes (Signed)
Established Patient Office Visit  Subjective:  Patient ID: Lynn Hughes, female    DOB: [birth year]  Age: 60 y.o. MRN: [id number]  CC:  Chief Complaint  Patient presents with   Follow-up   Hypertension    HPI Lynn Hughes presents for Patient is currently maintained on the following medications for blood pressure: see below.  Patient reports good compliance with blood pressure medications. Patient denies chest pain, shortness of breath or swelling. Last 3 blood pressure readings in our office are as follows:   Vitals with BMI 02/01/2021 12/23/2020 06/21/2020  Height 4' 11.016" 4' 11"  4' 11"   Weight 262 lbs 10 oz 262 lbs 265 lbs  BMI 53.01 01.60 10.93  Systolic 235 573 220  Diastolic 82 98 80  Pulse 96 - -    She is on atorvastatin.  Diabetes Composite Score: 5  Values used to calculate this score:   Points  Metrics      1        Blood Pressure: 128/82      1        Prescribed Statins: N/A - patient does not meet statin therapy criteria      1        Hemoglobin A1c: 7.1%      1        Smokes Tobacco: No      1        Prescribed Aspirin: N/A - patient does not meet cardiovascular risk criteria  Not checking glucose regularly. Has been out of Jardiance for a few days.    Patient  denies any fever, body aches,chills, rash, chest pain, shortness of breath, nausea, vomiting, or diarrhea.  Denies dizziness, lightheadedness, pre syncopal or syncopal episodes.   Past Medical History:  Diagnosis Date   Allergy    Anemia    Anxiety    Arthritis    Diabetes mellitus without complication ([place])    Fibroids    Heart murmur    Hypertension     Past Surgical History:  Procedure Laterality Date   BOWEL RESECTION N/A 08/22/2018   Procedure: SMALL BOWEL RESECTION;  Surgeon: Jules Husbands, MD;  Location: [facility] ORS;  Service: General;  Laterality: N/A;   BREAST EXCISIONAL BIOPSY Left 1995   surgical exc calcs neg    VENTRAL HERNIA REPAIR N/A 08/22/2018   Procedure: HERNIA  REPAIR VENTRAL ADULT;  Surgeon: Jules Husbands, MD;  Location: [facility] OR[facility];  Service: General;  Laterality: N/A;    Family History  Problem Relation Age of Onset   Prostate cancer Father    Uterine cancer Maternal Aunt 99   Cancer Paternal Aunt        not sure   Heart attack Paternal Uncle        x2 uncles   Cirrhosis Maternal Grandmother    Endometrial cancer Sister    Fibroids Sister     Social History   Socioeconomic History   Marital status: Single    Spouse name: Not on file   Number of children: Not on file   Years of education: Not on file   Highest education level: Not on file  Occupational History   Not on file  Tobacco Use   Smoking status: Former    Types: Cigarettes    Quit date: 09/04/1986    Years since quitting: 34.4   Smokeless tobacco: Never  Vaping Use   Vaping Use: Never used  Substance and Sexual Activity   Alcohol use: Not  on file    Comment: occassionally   Drug use: Never   Sexual activity: Not Currently    Birth control/protection: None  Other Topics Concern   Not on file  Social History Narrative   Not on file   Social Determinants of Health   Financial Resource Strain: Not on file  Food Insecurity: Not on file  Transportation Needs: Not on file  Physical Activity: Not on file  Stress: Not on file  Social Connections: Not on file  Intimate Partner Violence: Not on file    Outpatient Medications Prior to Visit  Medication Sig Dispense Refill   metoprolol succinate (TOPROL-XL) 50 MG 24 hr tablet Take 1 tablet (50 mg total) by mouth daily. Take with or immediately following a meal. 90 tablet 2   amLODipine (NORVASC) 10 MG tablet Take 1 tablet (10 mg total) by mouth daily. 90 tablet 2   atorvastatin (LIPITOR) 10 MG tablet Take 1 tablet (10 mg total) by mouth daily. 90 tablet 2   blood glucose meter kit and supplies Dispense based on patient and insurance preference. Check fasting am glucose as well as before meals for total four times  daily.  (FOR ICD-10 E10.9, E11.9).Hypertension associated with diabetes ([place]) 1 each 1   empagliflozin (JARDIANCE) 10 MG TABS tablet Take 1 tablet (10 mg total) by mouth daily. 90 tablet 1   fluticasone (FLONASE) 50 MCG/ACT nasal spray Place 1 spray into both nostrils daily. 16 mL 4   Lancets (ONETOUCH DELICA PLUS [id number]) MISC      ONETOUCH ULTRA test strip      triamterene-hydrochlorothiazide (MAXZIDE-25) 37.5-25 MG tablet Take 1 tablet by mouth daily. 90 tablet 2   No facility-administered medications prior to visit.    Allergies  Allergen Reactions   Ace Inhibitors Swelling    Swelling of face and lips   Codeine Nausea And Vomiting    ROS Review of Systems  Constitutional: Negative.   Respiratory: Negative.  Negative for apnea.   Cardiovascular: Negative.   Gastrointestinal: Negative.        Uterine fibroids seeing Dr. Kenton Kingfisher.   Genitourinary: Negative.   Musculoskeletal: Negative.   Skin: Negative.      Objective:   Blood pressure 128/82, pulse 96, resp. rate 17, height 4' 11.02" (1.499 m), weight 262 lb 9.6 oz (119.1 kg), SpO2 95 %.   Vitals with BMI 02/01/2021 12/23/2020 06/21/2020  Height 4' 11.016" 4' 11"  4' 11"   Weight 262 lbs 10 oz 262 lbs 265 lbs  BMI 53.01 83.66 29.47  Systolic 654 650 354  Diastolic 82 98 80  Pulse 96 - -    Physical Exam Vitals reviewed.  Constitutional:      General: She is not in acute distress.    Appearance: She is well-developed. She is obese. She is not diaphoretic.     Interventions: She is not intubated. HENT:     Head: Normocephalic and atraumatic.     Right Ear: External ear normal.     Left Ear: External ear normal.     Nose: Nose normal.     Mouth/Throat:     Mouth: Mucous membranes are moist.     Pharynx: No oropharyngeal exudate.  Eyes:     General: Lids are normal. No scleral icterus.       Right eye: No discharge.        Left eye: No discharge.     Conjunctiva/sclera: Conjunctivae normal.     Right eye: Right  conjunctiva is  not injected. No exudate or hemorrhage.    Left eye: Left conjunctiva is not injected. No exudate or hemorrhage.    Pupils: Pupils are equal, round, and reactive to light.  Neck:     Thyroid: No thyroid mass or thyromegaly.     Vascular: Normal carotid pulses. No carotid bruit, hepatojugular reflux or JVD.     Trachea: Trachea and phonation normal. No tracheal tenderness or tracheal deviation.     Meningeal: Brudzinski's sign and Kernig's sign absent.  Cardiovascular:     Rate and Rhythm: Normal rate and regular rhythm.     Pulses: Normal pulses.          Radial pulses are 2+ on the right side and 2+ on the left side.       Dorsalis pedis pulses are 2+ on the right side and 2+ on the left side.       Posterior tibial pulses are 2+ on the right side and 2+ on the left side.     Heart sounds: Normal heart sounds, S1 normal and S2 normal. Heart sounds not distant. No murmur heard.   No friction rub. No gallop.  Pulmonary:     Effort: Pulmonary effort is normal. No tachypnea, bradypnea, accessory muscle usage or respiratory distress. She is not intubated.     Breath sounds: Normal breath sounds. No stridor. No wheezing or rales.  Chest:     Chest wall: No tenderness.  Abdominal:     General: Bowel sounds are normal. There is no distension or abdominal bruit.     Palpations: Abdomen is soft. There is no shifting dullness, fluid wave, hepatomegaly, splenomegaly, mass or pulsatile mass.     Tenderness: There is no abdominal tenderness. There is no guarding or rebound.     Hernia: No hernia is present.  Musculoskeletal:        General: No tenderness or deformity. Normal range of motion.     Cervical back: Full passive range of motion without pain, normal range of motion and neck supple. No edema, erythema or rigidity. No spinous process tenderness or muscular tenderness. Normal range of motion.  Lymphadenopathy:     Head:     Right side of head: No submental, submandibular,  tonsillar, preauricular, posterior auricular or occipital adenopathy.     Left side of head: No submental, submandibular, tonsillar, preauricular, posterior auricular or occipital adenopathy.     Cervical: No cervical adenopathy.     Right cervical: No superficial, deep or posterior cervical adenopathy.    Left cervical: No superficial, deep or posterior cervical adenopathy.     Upper Body:     Right upper body: No supraclavicular or pectoral adenopathy.     Left upper body: No supraclavicular or pectoral adenopathy.  Skin:    General: Skin is warm and dry.     Coloration: Skin is not pale.     Findings: No abrasion, bruising, burn, ecchymosis, erythema, lesion, petechiae or rash.     Nails: There is no clubbing.  Neurological:     Mental Status: She is alert and oriented to person, place, and time.     GCS: GCS eye subscore is 4. GCS verbal subscore is 5. GCS motor subscore is 6.     Cranial Nerves: No cranial nerve deficit.     Sensory: No sensory deficit.     Motor: No tremor, atrophy, abnormal muscle tone or seizure activity.     Coordination: Coordination normal.     Gait: Gait  normal.     Deep Tendon Reflexes: Reflexes are normal and symmetric. Reflexes normal. Babinski sign absent on the right side. Babinski sign absent on the left side.     Reflex Scores:      Tricep reflexes are 2+ on the right side and 2+ on the left side.      Bicep reflexes are 2+ on the right side and 2+ on the left side.      Brachioradialis reflexes are 2+ on the right side and 2+ on the left side.      Patellar reflexes are 2+ on the right side and 2+ on the left side.      Achilles reflexes are 2+ on the right side and 2+ on the left side. Psychiatric:        Speech: Speech normal.        Behavior: Behavior normal.        Thought Content: Thought content normal.        Judgment: Judgment normal.    BP 128/82    Pulse 96    Resp 17    Ht 4' 11.02" (1.499 m)    Wt 262 lb 9.6 oz (119.1 kg)    SpO2 95%     BMI 53.01 kg/m  Wt Readings from Last 3 Encounters:  02/01/21 262 lb 9.6 oz (119.1 kg)  12/23/20 262 lb (118.8 kg)  06/21/20 265 lb (120.2 kg)     Health Maintenance Due  Topic Date Due   Pneumococcal Vaccine 96-23 Years old (1 - PCV) Never done   Hepatitis C Screening  Never done   TETANUS/TDAP  Never done   COLONOSCOPY (Pts 45-95yr Insurance coverage will need to be confirmed)  Never done   Zoster Vaccines- Shingrix (1 of 2) Never done   COVID-19 Vaccine (4 - Booster for Pfizer series) 02/03/2020   INFLUENZA VACCINE  Never done    There are no preventive care reminders to display for this patient.  Lab Results  Component Value Date   TSH 2.52 06/16/2020   Lab Results  Component Value Date   WBC 7.6 06/16/2020   HGB 13.5 06/16/2020   HCT 41.9 06/16/2020   MCV 80.2 06/16/2020   PLT 315.0 06/16/2020   Lab Results  Component Value Date   NA 138 09/27/2020   K 3.8 09/27/2020   CO2 31 09/27/2020   GLUCOSE 131 (H) 09/27/2020   BUN 14 09/27/2020   CREATININE 0.86 09/27/2020   BILITOT 0.5 09/27/2020   ALKPHOS 79 09/27/2020   AST 11 09/27/2020   ALT 8 09/27/2020   PROT 7.5 09/27/2020   ALBUMIN 4.0 09/27/2020   CALCIUM 9.9 09/27/2020   ANIONGAP 6 08/24/2018   GFR 72.64 09/27/2020   Lab Results  Component Value Date   CHOL 124 06/16/2020   Lab Results  Component Value Date   HDL 52 06/16/2020   Lab Results  Component Value Date   LDLCALC 56 06/16/2020   Lab Results  Component Value Date   TRIG 79 06/16/2020   Lab Results  Component Value Date   CHOLHDL 3.4 09/02/2019   Lab Results  Component Value Date   HGBA1C 7.1 (H) 09/27/2020      Assessment & Plan:   Problem List Items Addressed This Visit       Cardiovascular and Mediastinum   Hypertension associated with diabetes ([place]   Relevant Medications   empagliflozin (JARDIANCE) 10 MG TABS tablet   amLODipine (NORVASC) 10 MG tablet  atorvastatin (LIPITOR) 10 MG tablet    triamterene-hydrochlorothiazide (MAXZIDE-25) 37.5-25 MG tablet   Other Relevant Orders   CBC with Differential/Platelet   Comprehensive metabolic panel   TSH     Respiratory   Seasonal allergic rhinitis due to pollen   Relevant Medications   fluticasone (FLONASE) 50 MCG/ACT nasal spray     Other   Hyperlipidemia   Relevant Medications   amLODipine (NORVASC) 10 MG tablet   atorvastatin (LIPITOR) 10 MG tablet   triamterene-hydrochlorothiazide (MAXZIDE-25) 37.5-25 MG tablet   Other Relevant Orders   Lipid Profile   Other Visit Diagnoses     Diabetes mellitus without complication ([place])    -  Primary   Relevant Medications   empagliflozin (JARDIANCE) 10 MG TABS tablet   atorvastatin (LIPITOR) 10 MG tablet   blood glucose meter kit and supplies   Lancets (ONETOUCH DELICA PLUS [id number]) MISC   ONETOUCH ULTRA test strip   Other Relevant Orders   Hemoglobin A1c   Urine Microalbumin w/creat. ratio       Meds ordered this encounter  Medications   empagliflozin (JARDIANCE) 10 MG TABS tablet    Sig: Take 1 tablet (10 mg total) by mouth daily.    Dispense:  90 tablet    Refill:  2   amLODipine (NORVASC) 10 MG tablet    Sig: Take 1 tablet (10 mg total) by mouth daily.    Dispense:  90 tablet    Refill:  2   atorvastatin (LIPITOR) 10 MG tablet    Sig: Take 1 tablet (10 mg total) by mouth daily.    Dispense:  90 tablet    Refill:  2   blood glucose meter kit and supplies    Sig: Dispense based on patient and insurance preference. Check fasting am glucose as well as before meals for total four times daily.  (FOR ICD-10 E10.9, E11.9).Hypertension associated with diabetes ([place])    Dispense:  1 each    Refill:  1    Order Specific Question:   Number of strips    Answer:   180    Order Specific Question:   Number of lancets    Answer:   180   fluticasone (FLONASE) 50 MCG/ACT nasal spray    Sig: Place 1 spray into both nostrils daily.    Dispense:  16 mL    Refill:  4    Lancets (ONETOUCH DELICA PLUS [id number]) MISC    Sig: Check blood sugar am fasting and TID before meals.    Dispense:  100 each    Refill:  1   ONETOUCH ULTRA test strip    Sig: Check blood sugar fasting am and TID before meals.    Dispense:  100 each    Refill:  1   triamterene-hydrochlorothiazide (MAXZIDE-25) 37.5-25 MG tablet    Sig: Take 1 tablet by mouth daily.    Dispense:  90 tablet    Refill:  1   1. Hypertension associated with diabetes ([place])  - CBC with Differential/Platelet - Comprehensive metabolic panel - TSH - empagliflozin (JARDIANCE) 10 MG TABS tablet; Take 1 tablet (10 mg total) by mouth daily.  Dispense: 90 tablet; Refill: 2 - amLODipine (NORVASC) 10 MG tablet; Take 1 tablet (10 mg total) by mouth daily.  Dispense: 90 tablet; Refill: 2 - triamterene-hydrochlorothiazide (MAXZIDE-25) 37.5-25 MG tablet; Take 1 tablet by mouth daily.  Dispense: 90 tablet; Refill: 1  2. Diabetes mellitus without complication ([facility]) - Hemoglobin A1c - Urine Microalbumin w/creat.  ratio - empagliflozin (JARDIANCE) 10 MG TABS tablet; Take 1 tablet (10 mg total) by mouth daily.  Dispense: 90 tablet; Refill: 2 - blood glucose meter kit and supplies; Dispense based on patient and insurance preference. Check fasting am glucose as well as before meals for total four times daily.  (FOR ICD-10 E10.9, E11.9).Hypertension associated with diabetes ([place])  Dispense: 1 each; Refill: 1 - Lancets (ONETOUCH DELICA PLUS [id number]) [place]; Check blood sugar am fasting and TID before meals.  Dispense: 100 each; Refill: 1 - ONETOUCH ULTRA test strip; Check blood sugar fasting am and TID before meals.  Dispense: 100 each; Refill: 1  3. Hyperlipidemia, unspecified hyperlipidemia type - atorvastatin (LIPITOR) 10 MG tablet; Take 1 tablet (10 mg total) by mouth daily.  Dispense: 90 tablet; Refill: 2  4. Seasonal allergic rhinitis due to pollen - fluticasone (FLONASE) 50 MCG/ACT nasal spray; Place 1 spray into both  nostrils daily.  Dispense: 16 mL; Refill: 4   Orders Placed This Encounter  Procedures   CBC with Differential/Platelet   Comprehensive metabolic panel   TSH   Hemoglobin A1c   Urine Microalbumin w/creat. ratio   Lipid Profile    Labs today she is fasting.  Needs diabetes foot exam. She would like to hold off on colonoscopy or cologuard until after next visit to discuss again.   Follow-up: Return in about 3 months (around 05/02/2021), or if symptoms worsen or fail to improve, for at any time for any worsening symptoms, Go to Emergency room/ urgent care if worse.   Red Flags discussed. The patient was given clear instructions to go to ER or return to medical center if any red flags develop, symptoms do not improve, worsen or new problems develop. They verbalized understanding.   Marcille Buffy, FNP

## 2021-02-01 NOTE — Patient Instructions (Addendum)
Psyllium granules or powder for solution What is this medication? PSYLLIUM (SIL i yum) is a bulk-forming fiber laxative. This medicine is used to treat constipation. Increasing fiber in the diet may also help lower cholesterol and promote heart health for some people. This medicine may be used for other purposes; ask your health care provider or pharmacist if you have questions. COMMON BRAND NAME(S): Fiber Therapy, GenFiber, Geri-Mucil, Hydrocil, Konsyl, Metamucil, Metamucil MultiHealth, Mucilin, Natural Fiber Therapy, Reguloid What should I tell my care team before I take this medication? They need to know if you have any of these conditions: blockage in your bowel difficulty swallowing inflammatory bowel disease phenylketonuria stomach or intestine problems sudden change in bowel habits lasting more than 2 weeks an unusual or allergic reaction to psyllium, other medicines, dyes, or preservatives pregnant or trying or get pregnant breast-feeding How should I use this medication? Mix this medicine into a full glass (240 mL) of water or other cool drink. Take this medicine by mouth. Follow the directions on the package labeling, or take as directed by your health care professional. Take your medicine at regular intervals. Do not take your medicine more often than directed. Talk to your pediatrician regarding the use of this medicine in children. While this drug may be prescribed for children as young as 70 years old for selected conditions, precautions do apply. Overdosage: If you think you have taken too much of this medicine contact a poison control center or emergency room at once. NOTE: This medicine is only for you. Do not share this medicine with others. What if I miss a dose? If you miss a dose, take it as soon as you can. If it is almost time for your next dose, take only that dose. Do not take double or extra doses. What may interact with this medication? Interactions are not expected.  Take this product at least 2 hours before or after other medicines. This list may not describe all possible interactions. Give your health care provider a list of all the medicines, herbs, non-prescription drugs, or dietary supplements you use. Also tell them if you smoke, drink alcohol, or use illegal drugs. Some items may interact with your medicine. What should I watch for while using this medication? Check with your doctor or health care professional if your symptoms do not start to get better or if they get worse. Stop using this medicine and contact your doctor or health care professional if you have rectal bleeding or if you have to treat your constipation for more than 1 week. These could be signs of a more serious condition. Drink several glasses of water a day while you are taking this medicine. This will help to relieve constipation and prevent dehydration. What side effects may I notice from receiving this medication? Side effects that you should report to your doctor or health care professional as soon as possible: allergic reactions like skin rash, itching or hives, swelling of the face, lips, or tongue breathing problems chest pain nausea, vomiting rectal bleeding trouble swallowing Side effects that usually do not require medical attention (report to your doctor or health care professional if they continue or are bothersome): bloating gas stomach cramps This list may not describe all possible side effects. Call your doctor for medical advice about side effects. You may report side effects to FDA at 1-800-FDA-1088. Where should I keep my medication? Keep out of the reach of children. Store at room temperature between 15 and 30 degrees C (59 and  86 degrees F). Protect from moisture. Throw away any unused medicine after the expiration date. NOTE: This sheet is a summary. It may not cover all possible information. If you have questions about this medicine, talk to your doctor,  pharmacist, or health care provider.  2022 Elsevier/Gold Standard (2017-06-19 00:00:00) Hypertension, Adult High blood pressure (hypertension) is when the force of blood pumping through the arteries is too strong. The arteries are the blood vessels that carry blood from the heart throughout the body. Hypertension forces the heart to work harder to pump blood and may cause arteries to become narrow or stiff. Untreated or uncontrolled hypertension can cause a heart attack, heart failure, a stroke, kidney disease, and other problems. A blood pressure reading consists of a higher number over a lower number. Ideally, your blood pressure should be below 120/80. The first ("top") number is called the systolic pressure. It is a measure of the pressure in your arteries as your heart beats. The second ("bottom") number is called the diastolic pressure. It is a measure of the pressure in your arteries as the heart relaxes. What are the causes? The exact cause of this condition is not known. There are some conditions that result in or are related to high blood pressure. What increases the risk? Some risk factors for high blood pressure are under your control. The following factors may make you more likely to develop this condition: Smoking. Having type 2 diabetes mellitus, high cholesterol, or both. Not getting enough exercise or physical activity. Being overweight. Having too much fat, sugar, calories, or salt (sodium) in your diet. Drinking too much alcohol. Some risk factors for high blood pressure may be difficult or impossible to change. Some of these factors include: Having chronic kidney disease. Having a family history of high blood pressure. Age. Risk increases with age. Race. You may be at higher risk if you are African American. Gender. Men are at higher risk than women before age 74. After age 16, women are at higher risk than men. Having obstructive sleep apnea. Stress. What are the signs or  symptoms? High blood pressure may not cause symptoms. Very high blood pressure (hypertensive crisis) may cause: Headache. Anxiety. Shortness of breath. Nosebleed. Nausea and vomiting. Vision changes. Severe chest pain. Seizures. How is this diagnosed? This condition is diagnosed by measuring your blood pressure while you are seated, with your arm resting on a flat surface, your legs uncrossed, and your feet flat on the floor. The cuff of the blood pressure monitor will be placed directly against the skin of your upper arm at the level of your heart. It should be measured at least twice using the same arm. Certain conditions can cause a difference in blood pressure between your right and left arms. Certain factors can cause blood pressure readings to be lower or higher than normal for a short period of time: When your blood pressure is higher when you are in a health care provider's office than when you are at home, this is called white coat hypertension. Most people with this condition do not need medicines. When your blood pressure is higher at home than when you are in a health care provider's office, this is called masked hypertension. Most people with this condition may need medicines to control blood pressure. If you have a high blood pressure reading during one visit or you have normal blood pressure with other risk factors, you may be asked to: Return on a different day to have your blood  pressure checked again. Monitor your blood pressure at home for 1 week or longer. If you are diagnosed with hypertension, you may have other blood or imaging tests to help your health care provider understand your overall risk for other conditions. How is this treated? This condition is treated by making healthy lifestyle changes, such as eating healthy foods, exercising more, and reducing your alcohol intake. Your health care provider may prescribe medicine if lifestyle changes are not enough to get your  blood pressure under control, and if: Your systolic blood pressure is above 130. Your diastolic blood pressure is above 80. Your personal target blood pressure may vary depending on your medical conditions, your age, and other factors. Follow these instructions at home: Eating and drinking  Eat a diet that is high in fiber and potassium, and low in sodium, added sugar, and fat. An example eating plan is called the DASH (Dietary Approaches to Stop Hypertension) diet. To eat this way: Eat plenty of fresh fruits and vegetables. Try to fill one half of your plate at each meal with fruits and vegetables. Eat whole grains, such as whole-wheat pasta, brown rice, or whole-grain bread. Fill about one fourth of your plate with whole grains. Eat or drink low-fat dairy products, such as skim milk or low-fat yogurt. Avoid fatty cuts of meat, processed or cured meats, and poultry with skin. Fill about one fourth of your plate with lean proteins, such as fish, chicken without skin, beans, eggs, or tofu. Avoid pre-made and processed foods. These tend to be higher in sodium, added sugar, and fat. Reduce your daily sodium intake. Most people with hypertension should eat less than 1,500 mg of sodium a day. Do not drink alcohol if: Your health care provider tells you not to drink. You are pregnant, may be pregnant, or are planning to become pregnant. If you drink alcohol: Limit how much you use to: 0-1 drink a day for women. 0-2 drinks a day for men. Be aware of how much alcohol is in your drink. In the [place] one drink equals one 12 oz bottle of beer (355 mL), one 5 oz glass of wine (148 mL), or one 1 oz glass of hard liquor (44 mL). Lifestyle  Work with your health care provider to maintain a healthy body weight or to lose weight. Ask what an ideal weight is for you. Get at least 30 minutes of exercise most days of the week. Activities may include walking, swimming, or biking. Include exercise to strengthen  your muscles (resistance exercise), such as Pilates or lifting weights, as part of your weekly exercise routine. Try to do these types of exercises for 30 minutes at least 3 days a week. Do not use any products that contain nicotine or tobacco, such as cigarettes, e-cigarettes, and chewing tobacco. If you need help quitting, ask your health care provider. Monitor your blood pressure at home as told by your health care provider. Keep all follow-up visits as told by your health care provider. This is important. Medicines Take over-the-counter and prescription medicines only as told by your health care provider. Follow directions carefully. Blood pressure medicines must be taken as prescribed. Do not skip doses of blood pressure medicine. Doing this puts you at risk for problems and can make the medicine less effective. Ask your health care provider about side effects or reactions to medicines that you should watch for. Contact a health care provider if you: Think you are having a reaction to a medicine you are  taking. Have headaches that keep coming back (recurring). Feel dizzy. Have swelling in your ankles. Have trouble with your vision. Get help right away if you: Develop a severe headache or confusion. Have unusual weakness or numbness. Feel faint. Have severe pain in your chest or abdomen. Vomit repeatedly. Have trouble breathing. Summary Hypertension is when the force of blood pumping through your arteries is too strong. If this condition is not controlled, it may put you at risk for serious complications. Your personal target blood pressure may vary depending on your medical conditions, your age, and other factors. For most people, a normal blood pressure is less than 120/80. Hypertension is treated with lifestyle changes, medicines, or a combination of both. Lifestyle changes include losing weight, eating a healthy, low-sodium diet, exercising more, and limiting alcohol. This  information is not intended to replace advice given to you by your health care provider. Make sure you discuss any questions you have with your health care provider. Document Revised: 10/03/2017 Document Reviewed: 10/03/2017 Elsevier Patient Education  2022 [place]. Diabetes Mellitus and [place] care is an important part of your health, especially when you have diabetes. Diabetes may cause you to have problems because of poor blood flow (circulation) to your feet and legs, which can cause your skin to: Become thinner and drier. Break more easily. Heal more slowly. Peel and crack. You may also have nerve damage (neuropathy) in your legs and feet, causing decreased feeling in them. This means that you may not notice minor injuries to your feet that could lead to more serious problems. Noticing and addressing any potential problems early is the best way to prevent future foot problems. How to care for your feet Foot hygiene  Wash your feet daily with warm water and mild soap. Do not use hot water. Then, pat your feet and the areas between your toes until they are completely dry. Do not soak your feet as this can dry your skin. Trim your toenails straight across. Do not dig under them or around the cuticle. File the edges of your nails with an emery board or nail file. Apply a moisturizing lotion or petroleum jelly to the skin on your feet and to dry, brittle toenails. Use lotion that does not contain alcohol and is unscented. Do not apply lotion between your toes. Shoes and socks Wear clean socks or stockings every day. Make sure they are not too tight. Do not wear knee-high stockings since they may decrease blood flow to your legs. Wear shoes that fit properly and have enough cushioning. Always look in your shoes before you put them on to be sure there are no objects inside. To break in new shoes, wear them for just a few hours a day. This prevents injuries on your feet. Wounds,  scrapes, corns, and calluses  Check your feet daily for blisters, cuts, bruises, sores, and redness. If you cannot see the bottom of your feet, use a mirror or ask someone for help. Do not cut corns or calluses or try to remove them with medicine. If you find a minor scrape, cut, or break in the skin on your feet, keep it and the skin around it clean and dry. You may clean these areas with mild soap and water. Do not clean the area with peroxide, alcohol, or iodine. If you have a wound, scrape, corn, or callus on your foot, look at it several times a day to make sure it is healing and not infected. Check  for: Redness, swelling, or pain. Fluid or blood. Warmth. Pus or a bad smell. General tips Do not cross your legs. This may decrease blood flow to your feet. Do not use heating pads or hot water bottles on your feet. They may burn your skin. If you have lost feeling in your feet or legs, you may not know this is happening until it is too late. Protect your feet from hot and cold by wearing shoes, such as at the beach or on hot pavement. Schedule a complete foot exam at least once a year (annually) or more often if you have foot problems. Report any cuts, sores, or bruises to your health care provider immediately. Where to find more information American Diabetes Association: www.diabetes.org Association of Diabetes [employer] & Education Specialists: www.diabeteseducator.org Contact a health care provider if: You have a medical condition that increases your risk of infection and you have any cuts, sores, or bruises on your feet. You have an injury that is not healing. You have redness on your legs or feet. You feel burning or tingling in your legs or feet. You have pain or cramps in your legs and feet. Your legs or feet are numb. Your feet always feel cold. You have pain around any toenails. Get help right away if: You have a wound, scrape, corn, or callus on your foot and: You have pain,  swelling, or redness that gets worse. You have fluid or blood coming from the wound, scrape, corn, or callus. Your wound, scrape, corn, or callus feels warm to the touch. You have pus or a bad smell coming from the wound, scrape, corn, or callus. You have a fever. You have a red line going up your leg. Summary Check your feet every day for blisters, cuts, bruises, sores, and redness. Apply a moisturizing lotion or petroleum jelly to the skin on your feet and to dry, brittle toenails. Wear shoes that fit properly and have enough cushioning. If you have foot problems, report any cuts, sores, or bruises to your health care provider immediately. Schedule a complete foot exam at least once a year (annually) or more often if you have foot problems. This information is not intended to replace advice given to you by your health care provider. Make sure you discuss any questions you have with your health care provider. Document Revised: 08/14/2019 Document Reviewed: 08/14/2019 Elsevier Patient Education  [place].

## 2021-02-02 LAB — MICROALBUMIN / CREATININE URINE RATIO
Creatinine,U: 63.6 mg/dL
Microalb Creat Ratio: 7.1 mg/g (ref 0.0–30.0)
Microalb, Ur: 4.5 mg/dL — ABNORMAL HIGH (ref 0.0–1.9)

## 2021-02-02 NOTE — Progress Notes (Signed)
Labs are stable. Micro albumin in urine is improved.  CMP glucose 104 improved.  Hemoglobin A1C is stable, still elevated, we may need to consider medication changes at next viist,  work on diet and exercise aggressively.  TSH for thyroid is within normal limits.

## 2021-02-17 ENCOUNTER — Other Ambulatory Visit: Payer: Self-pay | Admitting: Obstetrics & Gynecology

## 2021-02-17 DIAGNOSIS — R350 Frequency of micturition: Secondary | ICD-10-CM

## 2021-02-17 DIAGNOSIS — N921 Excessive and frequent menstruation with irregular cycle: Secondary | ICD-10-CM

## 2021-02-17 DIAGNOSIS — D219 Benign neoplasm of connective and other soft tissue, unspecified: Secondary | ICD-10-CM

## 2021-04-25 ENCOUNTER — Encounter: Payer: Self-pay | Admitting: Adult Health

## 2021-04-25 ENCOUNTER — Ambulatory Visit: Payer: 59 | Admitting: Adult Health

## 2021-04-25 ENCOUNTER — Other Ambulatory Visit: Payer: Self-pay

## 2021-04-25 VITALS — BP 116/74 | HR 91 | Temp 98.6°F | Ht 59.02 in | Wt 262.6 lb

## 2021-04-25 DIAGNOSIS — Z1211 Encounter for screening for malignant neoplasm of colon: Secondary | ICD-10-CM | POA: Diagnosis not present

## 2021-04-25 DIAGNOSIS — Z1159 Encounter for screening for other viral diseases: Secondary | ICD-10-CM

## 2021-04-25 DIAGNOSIS — E785 Hyperlipidemia, unspecified: Secondary | ICD-10-CM

## 2021-04-25 DIAGNOSIS — E1159 Type 2 diabetes mellitus with other circulatory complications: Secondary | ICD-10-CM

## 2021-04-25 DIAGNOSIS — E119 Type 2 diabetes mellitus without complications: Secondary | ICD-10-CM | POA: Diagnosis not present

## 2021-04-25 DIAGNOSIS — I152 Hypertension secondary to endocrine disorders: Secondary | ICD-10-CM

## 2021-04-25 LAB — COMPREHENSIVE METABOLIC PANEL
ALT: 8 U/L (ref 0–35)
AST: 11 U/L (ref 0–37)
Albumin: 4.2 g/dL (ref 3.5–5.2)
Alkaline Phosphatase: 70 U/L (ref 39–117)
BUN: 18 mg/dL (ref 6–23)
CO2: 31 mEq/L (ref 19–32)
Calcium: 10.2 mg/dL (ref 8.4–10.5)
Chloride: 99 mEq/L (ref 96–112)
Creatinine, Ser: 0.81 mg/dL (ref 0.40–1.20)
GFR: 77.74 mL/min (ref >60.00)
Glucose, Bld: 101 mg/dL — ABNORMAL HIGH (ref 70–99)
Potassium: 3.9 mEq/L (ref 3.5–5.1)
Sodium: 139 mEq/L (ref 135–145)
Total Bilirubin: 0.4 mg/dL (ref 0.2–1.2)
Total Protein: 7.3 g/dL (ref 6.0–8.3)

## 2021-04-25 LAB — CBC WITH DIFFERENTIAL/PLATELET
Basophils Absolute: 0 10*3/uL (ref 0.0–0.1)
Basophils Relative: 0.6 % (ref 0.0–3.0)
Eosinophils Absolute: 0.2 10*3/uL (ref 0.0–0.7)
Eosinophils Relative: 2.7 % (ref 0.0–5.0)
HCT: 43.9 % (ref 36.0–46.0)
Hemoglobin: 14.3 g/dL (ref 12.0–15.0)
Lymphocytes Relative: 15.4 % (ref 12.0–46.0)
Lymphs Abs: 1.1 10*3/uL (ref 0.7–4.0)
MCHC: 32.5 g/dL (ref 30.0–36.0)
MCV: 82.9 fl (ref 78.0–100.0)
Monocytes Absolute: 0.4 10*3/uL (ref 0.1–1.0)
Monocytes Relative: 5.3 % (ref 3.0–12.0)
Neutro Abs: 5.5 10*3/uL (ref 1.4–7.7)
Neutrophils Relative %: 76 % (ref 43.0–77.0)
Platelets: 315 10*3/uL (ref 150.0–400.0)
RBC: 5.29 Mil/uL — ABNORMAL HIGH (ref 3.87–5.11)
RDW: 18 % — ABNORMAL HIGH (ref 11.5–15.5)
WBC: 7.2 10*3/uL (ref 4.0–10.5)

## 2021-04-25 LAB — LDL CHOLESTEROL, DIRECT: Direct LDL: 55 mg/dL

## 2021-04-25 LAB — HEMOGLOBIN A1C: Hgb A1c MFr Bld: 7.2 % — ABNORMAL HIGH (ref 4.6–6.5)

## 2021-04-25 LAB — TSH: TSH: 2.28 u[IU]/mL (ref 0.35–5.50)

## 2021-04-25 MED ORDER — ONETOUCH DELICA PLUS LANCET30G MISC: 3 refills | Status: DC

## 2021-04-25 MED ORDER — ONETOUCH ULTRA VI STRP: ORAL_STRIP | 3 refills | Status: DC

## 2021-04-25 MED ORDER — METOPROLOL SUCCINATE ER 50 MG PO TB24: 50.0000 mg | ORAL_TABLET | Freq: Every day | ORAL | 2 refills | Status: DC

## 2021-04-25 MED ORDER — AMLODIPINE BESYLATE 10 MG PO TABS: 10.0000 mg | ORAL_TABLET | Freq: Every day | ORAL | 3 refills | Status: DC

## 2021-04-25 MED ORDER — ATORVASTATIN CALCIUM 10 MG PO TABS: 10.0000 mg | ORAL_TABLET | Freq: Every day | ORAL | 2 refills | Status: DC

## 2021-04-25 MED ORDER — EMPAGLIFLOZIN 10 MG PO TABS: 10.0000 mg | ORAL_TABLET | Freq: Every day | ORAL | 3 refills | Status: DC

## 2021-04-25 MED ORDER — TRIAMTERENE-HCTZ 37.5-25 MG PO TABS: 1.0000 | ORAL_TABLET | Freq: Every day | ORAL | 1 refills | Status: DC

## 2021-04-25 NOTE — Progress Notes (Signed)
? ? ? ? ?Established Patient Office Visit ? ?Subjective:  ?Patient ID: Lynn Hughes, female    DOB: [birth year]  Age: 61 y.o. MRN: [id number] ? ?CC:  ?Chief Complaint  ?Patient presents with  ? Follow-up  ? ? ?HPI ?Lynn Hughes presents for follow up on Diabetes follow up. Denies any hypoglycemia at home, due for A1C and labs today.  ?Eye exam is due. She is given the number for [facility] to call for diabetic eye exam.  ?Foot exam today. She is taking Jardiance 10 mg once daily without any unwanted side effects.  ?Blood pressure  116/74 today and denies any dizziness or lightheadedness.  ?Patient  denies any fever, body aches,chills, rash, chest pain, shortness of breath, nausea, vomiting, or diarrhea. Denies any chest pain or palpitation.  ?Denies dizziness, lightheadedness, pre syncopal or syncopal episodes.  ? ? ?Declines TDAP  ?Declined Zoster as well.  ? ?Patient  denies any fever, body aches,chills, rash, chest pain, shortness of breath, nausea, vomiting, or diarrhea.  ?Denies dizziness, lightheadedness, pre syncopal or syncopal episodes.  ? ? ?Past Medical History:  ?Diagnosis Date  ? Allergy   ? Anemia   ? Anxiety   ? Arthritis   ? Diabetes mellitus without complication ([place])   ? Fibroids   ? Heart murmur   ? Hypertension   ? ? ?Past Surgical History:  ?Procedure Laterality Date  ? BOWEL RESECTION N/A 08/22/2018  ? Procedure: SMALL BOWEL RESECTION;  Surgeon: Jules Husbands, MD;  Location: [facility] ORS;  Service: General;  Laterality: N/A;  ? BREAST EXCISIONAL BIOPSY Left 1995  ? surgical exc calcs neg   ? VENTRAL HERNIA REPAIR N/A 08/22/2018  ? Procedure: HERNIA REPAIR VENTRAL ADULT;  Surgeon: Jules Husbands, MD;  Location: [facility] ORS;  Service: General;  Laterality: N/A;  ? ? ?Family History  ?Problem Relation Age of Onset  ? Prostate cancer Father   ? Uterine cancer Maternal Aunt 40  ? Cancer Paternal Aunt   ?     not sure  ? Heart attack Paternal Uncle   ?     x2 uncles  ? Cirrhosis Maternal  Grandmother   ? Endometrial cancer Sister   ? Fibroids Sister   ? ? ?Social History  ? ?Socioeconomic History  ? Marital status: Single  ?  Spouse name: Not on file  ? Number of children: Not on file  ? Years of education: Not on file  ? Highest education level: Not on file  ?Occupational History  ? Not on file  ?Tobacco Use  ? Smoking status: Former  ?  Types: Cigarettes  ?  Quit date: 09/04/1986  ?  Years since quitting: 34.6  ? Smokeless tobacco: Never  ?Vaping Use  ? Vaping Use: Never used  ?Substance and Sexual Activity  ? Alcohol use: Not on file  ?  Comment: occassionally  ? Drug use: Never  ? Sexual activity: Not Currently  ?  Birth control/protection: None  ?Other Topics Concern  ? Not on file  ?Social History Narrative  ? Not on file  ? ?Social Determinants of Health  ? ?Financial Resource Strain: Not on file  ?Food Insecurity: Not on file  ?Transportation Needs: Not on file  ?Physical Activity: Not on file  ?Stress: Not on file  ?Social Connections: Not on file  ?Intimate Partner Violence: Not on file  ? ? ?Outpatient Medications Prior to Visit  ?Medication Sig Dispense Refill  ? blood glucose meter kit and supplies  Dispense based on patient and insurance preference. Check fasting am glucose as well as before meals for total four times daily.  (FOR ICD-10 E10.9, E11.9).Hypertension associated with diabetes ([place]) 1 each 1  ? amLODipine (NORVASC) 10 MG tablet Take 1 tablet (10 mg total) by mouth daily. 90 tablet 2  ? atorvastatin (LIPITOR) 10 MG tablet Take 1 tablet (10 mg total) by mouth daily. 90 tablet 2  ? empagliflozin (JARDIANCE) 10 MG TABS tablet Take 1 tablet (10 mg total) by mouth daily. 90 tablet 2  ? fluticasone (FLONASE) 50 MCG/ACT nasal spray Place 1 spray into both nostrils daily. 16 mL 4  ? Lancets (ONETOUCH DELICA PLUS [id number]) MISC Check blood sugar am fasting and TID before meals. 100 each 1  ? metoprolol succinate (TOPROL-XL) 50 MG 24 hr tablet Take 1 tablet (50 mg total) by mouth daily.  Take with or immediately following a meal. 90 tablet 2  ? ONETOUCH ULTRA test strip Check blood sugar fasting am and TID before meals. 100 each 1  ? triamterene-hydrochlorothiazide (MAXZIDE-25) 37.5-25 MG tablet Take 1 tablet by mouth daily. 90 tablet 1  ? ?No facility-administered medications prior to visit.  ? ? ?Allergies  ?Allergen Reactions  ? Ace Inhibitors Swelling  ?  Swelling of face and lips  ? Codeine Nausea And Vomiting  ? ? ?ROS ?Review of Systems  ?Constitutional: Negative.   ?HENT: Negative.    ?Respiratory: Negative.    ?Cardiovascular: Negative.   ?Gastrointestinal: Negative.   ?Genitourinary: Negative.   ?Skin: Negative.   ?Neurological: Negative.   ?Hematological: Negative.   ?Psychiatric/Behavioral: Negative.    ? ?  ?Objective:  ?  ?Physical Exam ?Vitals reviewed.  ?Constitutional:   ?   General: She is not in acute distress. ?   Appearance: She is obese. She is not ill-appearing, toxic-appearing or diaphoretic.  ?HENT:  ?   Head: Normocephalic and atraumatic.  ?   Right Ear: Tympanic membrane, ear canal and external ear normal. There is no impacted cerumen.  ?   Left Ear: Tympanic membrane, ear canal and external ear normal. There is no impacted cerumen.  ?   Nose: Nose normal. No congestion or rhinorrhea.  ?   Mouth/Throat:  ?   Mouth: Mucous membranes are moist.  ?   Pharynx: Oropharynx is clear. No oropharyngeal exudate or posterior oropharyngeal erythema.  ?Eyes:  ?   General: No scleral icterus.    ?   Right eye: No discharge.     ?   Left eye: No discharge.  ?   Conjunctiva/sclera: Conjunctivae normal.  ?   Pupils: Pupils are equal, round, and reactive to light.  ?Neck:  ?   Vascular: No carotid bruit.  ?Cardiovascular:  ?   Rate and Rhythm: Normal rate and regular rhythm.  ?   Pulses: Normal pulses.     ?     Dorsalis pedis pulses are 2+ on the right side and 2+ on the left side.  ?     Posterior tibial pulses are 2+ on the right side and 2+ on the left side.  ?   Heart sounds: Normal  heart sounds. No murmur heard. ?  No friction rub. No gallop.  ?Pulmonary:  ?   Effort: Pulmonary effort is normal. No respiratory distress.  ?   Breath sounds: Normal breath sounds. No stridor. No wheezing, rhonchi or rales.  ?Chest:  ?   Chest wall: No tenderness.  ?Abdominal:  ?   General: Bowel  sounds are normal. There is no distension.  ?   Palpations: There is no mass.  ?   Tenderness: There is no abdominal tenderness. There is no guarding.  ?   Hernia: No hernia is present.  ?Musculoskeletal:     ?   General: Normal range of motion.  ?   Cervical back: Normal range of motion and neck supple. No rigidity or tenderness.  ?   Right lower leg: No edema.  ?   Left lower leg: No edema.  ?   Right foot: Normal range of motion. No deformity, bunion, Charcot foot, foot drop or prominent metatarsal heads.  ?   Left foot: Normal range of motion. No deformity, bunion, Charcot foot, foot drop or prominent metatarsal heads.  ?Feet:  ?   Right foot:  ?   Protective Sensation: 5 sites tested.  5 sites sensed.  ?   Skin integrity: Dry skin present. No ulcer, blister, skin breakdown, erythema, warmth, callus or fissure.  ?   Toenail Condition: Right toenails are normal.  ?   Left foot:  ?   Protective Sensation: 5 sites tested.  5 sites sensed.  ?   Skin integrity: Dry skin present. No ulcer, blister, skin breakdown, erythema, warmth, callus or fissure.  ?   Toenail Condition: Left toenails are normal.  ?Lymphadenopathy:  ?   Cervical: No cervical adenopathy.  ?Skin: ?   General: Skin is warm.  ?   Findings: No erythema or rash.  ?Neurological:  ?   Mental Status: She is oriented to person, place, and time.  ?Psychiatric:     ?   Mood and Affect: Mood normal.     ?   Behavior: Behavior normal.     ?   Thought Content: Thought content normal.     ?   Judgment: Judgment normal.  ? ? ?BP 116/74 (BP Location: Left Arm, Patient Position: Sitting, Cuff Size: Large)   Pulse 91   Temp 98.6 ?F (37 ?C) (Oral)   Ht 4' 11.02" (1.499 m)    Wt 262 lb 9.6 oz (119.1 kg)   SpO2 97%   BMI 53.00 kg/m?  ?Wt Readings from Last 3 Encounters:  ?04/25/21 262 lb 9.6 oz (119.1 kg)  ?02/01/21 262 lb 9.6 oz (119.1 kg)  ?12/23/20 262 lb (118.8 kg)  ? ? ? ?[place]

## 2021-04-25 NOTE — Assessment & Plan Note (Signed)
Amlodipine '10mg'$  po qd.  ?Metoprolol 50 mg 24 tablet once daily.  ?Maxide 37.5-25 mg po qd  ?Controlled blood pressure.  ?Denies dizziness, lightheadedness, pre syncopal or syncopal episodes.  ? ?

## 2021-04-25 NOTE — Patient Instructions (Addendum)
[facility] ?Ophthalmology clinic in [place], [place] ?Address: [address] [place], [place] [address] ?Open ? Closes 5?PM ?Phone: [phone] ? ? ?Diabetes Mellitus and Nutrition, Adult ?When you have diabetes, or diabetes mellitus, it is very important to have healthy eating habits because your blood sugar (glucose) levels are greatly affected by what you eat and drink. Eating healthy foods in the right amounts, at about the same times every day, can help you: ?Manage your blood glucose. ?Lower your risk of heart disease. ?Improve your blood pressure. ?Reach or maintain a healthy weight. ?What can affect my meal plan? ?Every person with diabetes is different, and each person has different needs for a meal plan. Your health care provider may recommend that you work with a dietitian to make a meal plan that is best for you. Your meal plan may vary depending on factors such as: ?The calories you need. ?The medicines you take. ?Your weight. ?Your blood glucose, blood pressure, and cholesterol levels. ?Your activity level. ?Other health conditions you have, such as heart or kidney disease. ?How do carbohydrates affect me? ?Carbohydrates, also called carbs, affect your blood glucose level more than any other type of food. Eating carbs raises the amount of glucose in your blood. ?It is important to know how many carbs you can safely have in each meal. This is different for every person. Your dietitian can help you calculate how many carbs you should have at each meal and for each snack. ?How does alcohol affect me? ?Alcohol can cause a decrease in blood glucose (hypoglycemia), especially if you use insulin or take certain diabetes medicines by mouth. Hypoglycemia can be a life-threatening condition. Symptoms of hypoglycemia, such as sleepiness, dizziness, and confusion, are similar to symptoms of having too much alcohol. ?Do not drink alcohol if: ?Your health care provider tells you not to  drink. ?You are pregnant, may be pregnant, or are planning to become pregnant. ?If you drink alcohol: ?Limit how much you have to: ?0-1 drink a day for women. ?0-2 drinks a day for men. ?Know how much alcohol is in your drink. In the [place] one drink equals one 12 oz bottle of beer (355 mL), one 5 oz glass of wine (148 mL), or one 1? oz glass of hard liquor (44 mL). ?Keep yourself hydrated with water, diet soda, or unsweetened iced tea. Keep in mind that regular soda, juice, and other mixers may contain a lot of sugar and must be counted as carbs. ?What are tips for following this plan? ?Reading food labels ?Start by checking the serving size on the Nutrition Facts label of packaged foods and drinks. The number of calories and the amount of carbs, fats, and other nutrients listed on the label are based on one serving of the item. Many items contain more than one serving per package. ?Check the total grams (g) of carbs in one serving. ?Check the number of grams of saturated fats and trans fats in one serving. Choose foods that have a low amount or none of these fats. ?Check the number of milligrams (mg) of salt (sodium) in one serving. Most people should limit total sodium intake to less than 2,300 mg per day. ?Always check the nutrition information of foods labeled as "low-fat" or "nonfat." These foods may be higher in added sugar or refined carbs and should be avoided. ?Talk to your dietitian to identify your daily goals for nutrients listed on the label. ?Shopping ?Avoid buying canned, pre-made, or processed foods. These  foods tend to be high in fat, sodium, and added sugar. ?Shop around the outside edge of the grocery store. This is where you will most often find fresh fruits and vegetables, bulk grains, fresh meats, and fresh dairy products. ?Cooking ?Use low-heat cooking methods, such as baking, instead of high-heat cooking methods, such as deep frying. ?Cook using healthy oils, such as olive, canola, or  sunflower oil. ?Avoid cooking with butter, cream, or high-fat meats. ?Meal planning ?Eat meals and snacks regularly, preferably at the same times every day. Avoid going long periods of time without eating. ?Eat foods that are high in fiber, such as fresh fruits, vegetables, beans, and whole grains. ?Eat 4-6 oz (112-168 g) of lean protein each day, such as lean meat, chicken, fish, eggs, or tofu. One ounce (oz) (28 g) of lean protein is equal to: ?1 oz (28 g) of meat, chicken, or fish. ?1 egg. ?? cup (62 g) of tofu. ?Eat some foods each day that contain healthy fats, such as avocado, nuts, seeds, and fish. ?What foods should I eat? ?Fruits ?Berries. Apples. Oranges. Peaches. Apricots. Plums. Grapes. Mangoes. Papayas. Pomegranates. Kiwi. Cherries. ?Vegetables ?Leafy greens, including lettuce, spinach, kale, chard, collard greens, mustard greens, and cabbage. Beets. Cauliflower. Broccoli. Carrots. Green beans. Tomatoes. Peppers. Onions. Cucumbers. Brussels sprouts. ?Grains ?Whole grains, such as whole-wheat or whole-grain bread, crackers, tortillas, cereal, and pasta. Unsweetened oatmeal. Quinoa. Brown or wild rice. ?Meats and other proteins ?Seafood. Poultry without skin. Lean cuts of poultry and beef. Tofu. Nuts. Seeds. ?Dairy ?Low-fat or fat-free dairy products such as milk, yogurt, and cheese. ?The items listed above may not be a complete list of foods and beverages you can eat and drink. Contact a dietitian for more information. ?What foods should I avoid? ?Fruits ?Fruits canned with syrup. ?Vegetables ?Canned vegetables. Frozen vegetables with butter or cream sauce. ?Grains ?Refined white flour and flour products such as bread, pasta, snack foods, and cereals. Avoid all processed foods. ?Meats and other proteins ?Fatty cuts of meat. Poultry with skin. Breaded or fried meats. Processed meat. Avoid saturated fats. ?Dairy ?Full-fat yogurt, cheese, or milk. ?Beverages ?Sweetened drinks, such as soda or iced tea. ?The  items listed above may not be a complete list of foods and beverages you should avoid. Contact a dietitian for more information. ?Questions to ask a health care provider ?Do I need to meet with a certified diabetes care and education specialist? ?Do I need to meet with a dietitian? ?What number can I call if I have questions? ?When are the best times to check my blood glucose? ?Where to find more information: ?American Diabetes Association: diabetes.org ?Academy of Nutrition and Dietetics: eatright.org ?[employer] of Diabetes and Digestive and Kidney Diseases: AmenCredit.is ?Association of Diabetes Care & Education Specialists: diabeteseducator.org ?Summary ?It is important to have healthy eating habits because your blood sugar (glucose) levels are greatly affected by what you eat and drink. It is important to use alcohol carefully. ?A healthy meal plan will help you manage your blood glucose and lower your risk of heart disease. ?Your health care provider may recommend that you work with a dietitian to make a meal plan that is best for you. ?This information is not intended to replace advice given to you by your health care provider. Make sure you discuss any questions you have with your health care provider. ?Document Revised: 08/27/2019 Document Reviewed: 08/27/2019 ?Elsevier Patient Education ? 2022 [place]. ? ? ?Calorie Counting for Weight Loss ?Calories are units of energy.  Your body needs a certain number of calories from food to keep going throughout the day. When you eat or drink more calories than your body needs, your body stores the extra calories mostly as fat. When you eat or drink fewer calories than your body needs, your body burns fat to get the energy it needs. ?Calorie counting means keeping track of how many calories you eat and drink each day. Calorie counting can be helpful if you need to lose weight. If you eat fewer calories than your body needs, you should lose weight. Ask your  health care provider what a healthy weight is for you. ?For calorie counting to work, you will need to eat the right number of calories each day to lose a healthy amount of weight per week. A dietitian can help yo

## 2021-04-26 ENCOUNTER — Other Ambulatory Visit: Payer: Self-pay

## 2021-04-26 ENCOUNTER — Telehealth: Payer: Self-pay

## 2021-04-26 DIAGNOSIS — R739 Hyperglycemia, unspecified: Secondary | ICD-10-CM

## 2021-04-26 DIAGNOSIS — E119 Type 2 diabetes mellitus without complications: Secondary | ICD-10-CM

## 2021-04-26 LAB — LIPID PANEL W/O CHOL/HDL RATIO
Cholesterol, Total: 135 mg/dL (ref 100–199)
HDL: 54 mg/dL (ref >39)
LDL Chol Calc (NIH): 65 mg/dL (ref 0–99)
Triglycerides: 82 mg/dL (ref 0–149)
VLDL Cholesterol Cal: 16 mg/dL (ref 5–40)

## 2021-04-26 LAB — HEPATITIS C ANTIBODY
Hepatitis C Ab: NONREACTIVE
SIGNAL TO CUT-OFF: 0.09 (ref <1.00)

## 2021-04-26 NOTE — Progress Notes (Signed)
?  Hemoglobin A1C ois not controlled, work on diet and exercise, would she be willing to try Metformin again ? We could consider trying to get ozempic approved as she reported no history of thyroid medullary cancer or pancreatitis.  ?[facility] for referral to Endocrinology as well if she is in agreement to see them for diabetes management. Nutritionist referral advised.  ?CMP ok. Elevated glucose. TSH for thyroid within normal limits.  ?CBC stable.  ?Lipid panel is within normal.  ?Direct LDL within normal.  ?

## 2021-04-26 NOTE — Telephone Encounter (Signed)
Pt advised of labs. ?Endo referral placed. ?

## 2021-04-26 NOTE — Progress Notes (Signed)
Hepatitis C is negative.

## 2021-05-02 ENCOUNTER — Ambulatory Visit: Payer: 59 | Admitting: Adult Health

## 2021-05-10 ENCOUNTER — Telehealth: Payer: Self-pay

## 2021-05-10 NOTE — Telephone Encounter (Signed)
Pt calling wanting to speak with [facility] about her fibroids. Came in 12/2020 and was supposed to get u/s, could not get the u/s since it was so expensive. She is wanting to get the u/s now and find out what [facility] thinks she should do or who she should see. I told pt I would send this to him and he will be back in the office tomorrow and we will give her a call then.  ?

## 2021-05-11 NOTE — Telephone Encounter (Signed)
Set up appt w another provider who will be here long term to order, manage, or refer, based on her needs.  (Ultrasound or other studies to be ordered by doctor seeing patient)

## 2021-05-12 NOTE — Telephone Encounter (Signed)
Please schedule accordingly.

## 2021-05-26 ENCOUNTER — Other Ambulatory Visit: Payer: Self-pay

## 2021-05-26 NOTE — Progress Notes (Signed)
Patient scheduled 6/30. ?

## 2021-06-09 ENCOUNTER — Ambulatory Visit (INDEPENDENT_AMBULATORY_CARE_PROVIDER_SITE_OTHER): Payer: 59

## 2021-06-09 ENCOUNTER — Other Ambulatory Visit: Payer: Self-pay | Admitting: Obstetrics and Gynecology

## 2021-06-09 DIAGNOSIS — D219 Benign neoplasm of connective and other soft tissue, unspecified: Secondary | ICD-10-CM

## 2021-06-09 NOTE — Progress Notes (Signed)
tr

## 2021-06-15 ENCOUNTER — Encounter: Payer: Self-pay | Admitting: Obstetrics and Gynecology

## 2021-06-15 ENCOUNTER — Ambulatory Visit: Payer: 59 | Admitting: Obstetrics and Gynecology

## 2021-06-15 ENCOUNTER — Telehealth: Payer: Self-pay

## 2021-06-15 VITALS — BP 150/90 | Ht 59.0 in | Wt 261.0 lb

## 2021-06-15 DIAGNOSIS — D252 Subserosal leiomyoma of uterus: Secondary | ICD-10-CM | POA: Diagnosis not present

## 2021-06-15 DIAGNOSIS — D251 Intramural leiomyoma of uterus: Secondary | ICD-10-CM

## 2021-06-15 DIAGNOSIS — D250 Submucous leiomyoma of uterus: Secondary | ICD-10-CM | POA: Diagnosis not present

## 2021-06-15 DIAGNOSIS — Z712 Person consulting for explanation of examination or test findings: Secondary | ICD-10-CM

## 2021-06-15 MED ORDER — MISOPROSTOL 200 MCG PO TABS: ORAL_TABLET | ORAL | 1 refills | Status: DC

## 2021-06-15 NOTE — Telephone Encounter (Signed)
-----   Message from Mora Bellman, MD sent at 06/15/2021 11:26 AM EDT ----- ?Patient plans to return for endometrial biopsy. ? ?Please inform her when scheduling the appointment that I sent a prescription for cytotec to her pharmacy to help soften the cervix and make the biopsy hopefully easier ? ?Thanks ? ?Peggy ? ?

## 2021-06-15 NOTE — Progress Notes (Signed)
61 yo P2 presenting today to discuss results of recent ultrasound. Patient reports intermittent monthly vaginal bleeding. This has been going on for several months. She denies pelvic pain or abnormal discharge ? ?Past Medical History:  ?Diagnosis Date  ? Allergy   ? Anemia   ? Anxiety   ? Arthritis   ? Diabetes mellitus without complication ([place])   ? Fibroids   ? Heart murmur   ? Hypertension   ? ?Past Surgical History:  ?Procedure Laterality Date  ? BOWEL RESECTION N/A 08/22/2018  ? Procedure: SMALL BOWEL RESECTION;  Surgeon: Jules Husbands, MD;  Location: [facility] ORS;  Service: General;  Laterality: N/A;  ? BREAST EXCISIONAL BIOPSY Left 1995  ? surgical exc calcs neg   ? VENTRAL HERNIA REPAIR N/A 08/22/2018  ? Procedure: HERNIA REPAIR VENTRAL ADULT;  Surgeon: Jules Husbands, MD;  Location: [facility] ORS;  Service: General;  Laterality: N/A;  ? ?Family History  ?Problem Relation Age of Onset  ? Prostate cancer Father   ? Uterine cancer Maternal Aunt 40  ? Cancer Paternal Aunt   ?     not sure  ? Heart attack Paternal Uncle   ?     x2 uncles  ? Cirrhosis Maternal Grandmother   ? Endometrial cancer Sister   ? Fibroids Sister   ? ?Social History  ? ?Tobacco Use  ? Smoking status: Former  ?  Types: Cigarettes  ?  Quit date: 09/04/1986  ?  Years since quitting: 34.8  ? Smokeless tobacco: Never  ?Vaping Use  ? Vaping Use: Never used  ?Substance Use Topics  ? Drug use: Never  ? ?ROS ?See pertinent in HPI. All other systems reviewed and non contributory ?Blood pressure (!) 150/90, height '4\' 11"'$  (1.499 m), weight 261 lb (118.4 kg). ?GENERAL: Well-developed, well-nourished female in no acute distress.  ?ABDOMEN: Soft, nontender, palpable fibroid uterus above umbilicus ?PELVIC: Not performed ?EXTREMITIES: No cyanosis, clubbing, or edema, 2+ distal pulses. ? ?06/09/21 Ultrasound ?ULTRASOUND REPORT ?  ?Location: [facility]  ?Date of Service: 06/09/2021  ?  ?Indications:Abnormal Uterine Bleeding; known fibroids ?Findings:  ?The  uterus is anteverted and measures 22 x 23 x 17 cm. ?Echo texture is heterogenous with evidence of focal masses. ?Within the uterus are multiple suspected fibroids measuring: ?Fibroid 1:  15 cm submucosal, this measures slightly smaller than 1 year ago, however with it's sized and limited visibility,  this is likely due to technical margin of error only. ?Fibroid 2:   4.6 cm calcified, mid body ?  ?The Endometrium is not visualized due to Fibroids. ?  ?Ovaries are not visualized. ?Survey of the adnexa demonstrates no adnexal masses. ?There is no free fluid in the cul de sac. ?  ?Impression: ?1. Fibroid uterus. ?  ?Recommendations: ?1.Clinical correlation with the patient's History and Physical Exam. ?  ?Vivien Rota  [place] ? ?A/P 61 yo with postmenopausal vaginal bleeding and fibroid uterus ?- Discussed the need to rule out carcinoma with endometrial biopsy ?- Patient is interested in hysterectomy. ?- Patient plans to return for endometrial biopsy in preparation for hysterectomy. Rx cytotec provided to take the night prior to her appointment ? ?

## 2021-06-15 NOTE — Telephone Encounter (Signed)
Patient aware.

## 2021-08-05 ENCOUNTER — Encounter: Payer: Self-pay | Admitting: Family

## 2021-08-05 ENCOUNTER — Ambulatory Visit: Payer: 59 | Admitting: Family

## 2021-08-05 VITALS — BP 110/70 | HR 93 | Temp 99.0°F | Ht 59.0 in | Wt 256.8 lb

## 2021-08-05 DIAGNOSIS — E119 Type 2 diabetes mellitus without complications: Secondary | ICD-10-CM | POA: Insufficient documentation

## 2021-08-05 DIAGNOSIS — I152 Hypertension secondary to endocrine disorders: Secondary | ICD-10-CM

## 2021-08-05 DIAGNOSIS — E1159 Type 2 diabetes mellitus with other circulatory complications: Secondary | ICD-10-CM | POA: Diagnosis not present

## 2021-08-05 DIAGNOSIS — I10 Essential (primary) hypertension: Secondary | ICD-10-CM

## 2021-08-05 DIAGNOSIS — E785 Hyperlipidemia, unspecified: Secondary | ICD-10-CM | POA: Diagnosis not present

## 2021-08-05 LAB — POCT GLYCOSYLATED HEMOGLOBIN (HGB A1C): Hemoglobin A1C: 6.6 % — AB (ref 4.0–5.6)

## 2021-08-05 NOTE — Assessment & Plan Note (Addendum)
Excellent control. Continue lipitor '10mg'$ .  Lab Results  Component Value Date   LDLCALC 65 04/25/2021

## 2021-08-05 NOTE — Assessment & Plan Note (Signed)
Chronic, stable.  Continue Maxide 37.5-25 mg, amlodipine '10mg'$ , metoprolol 50 mg daily

## 2021-08-05 NOTE — Assessment & Plan Note (Signed)
Lab Results  Component Value Date   HGBA1C 6.6 (A) 08/05/2021   Excellent control.  Congratulated patient.  Continue Jardiance 10 mg.  Pending microalbumin as would like for patient to be on ARB.  Will await urine result

## 2021-08-05 NOTE — Progress Notes (Signed)
Subjective:    Patient ID: Lynn Hughes, female    DOB: 1960/12/05, 61 y.o.   MRN: [id number]  CC: Lynn Hughes is a 61 y.o. female who presents today to establish care.    HPI: Feels well today.  No new complaints.  She is following with GYN for uterine fibroids  HTN-compliant with Maxide 37.5-25 mg, amlodipine 65m, metoprolol succinate 524m   Denies CP  Hyperlipidemia-compliant with Lipitor 10 mg  Diabetes-compliant with Jardiance 10 mg    HISTORY:  Past Medical History:  Diagnosis Date   Allergy    Anemia    Anxiety    Arthritis    Diabetes mellitus without complication ([facility]   Fibroids    Heart murmur    Hypertension    Past Surgical History:  Procedure Laterality Date   BOWEL RESECTION N/A 08/22/2018   Procedure: SMALL BOWEL RESECTION;  Surgeon: PaJules HusbandsMD;  Location: [facility] ORS;  Service: General;  Laterality: N/A;   BREAST EXCISIONAL BIOPSY Left 1995   surgical exc calcs neg    VENTRAL HERNIA REPAIR N/A 08/22/2018   Procedure: HERNIA REPAIR VENTRAL ADULT;  Surgeon: PaJules HusbandsMD;  Location: [facility] ORS;  Service: General;  Laterality: N/A;   Family History  Problem Relation Age of Onset   Prostate cancer Father    Uterine cancer Maternal Aunt 407[address] Cancer Paternal Aunt        not sure   Heart attack Paternal Uncle        x2 uncles   Cirrhosis Maternal Grandmother    Endometrial cancer Sister    Fibroids Sister     Allergies: Ace inhibitors and Codeine Current Outpatient Medications on File Prior to Visit  Medication Sig Dispense Refill   amLODipine (NORVASC) 10 MG tablet Take 1 tablet (10 mg total) by mouth daily. 90 tablet 3   atorvastatin (LIPITOR) 10 MG tablet Take 1 tablet (10 mg total) by mouth daily. 90 tablet 2   blood glucose meter kit and supplies Dispense based on patient and insurance preference. Check fasting am glucose as well as before meals for total four times daily.  (FOR ICD-10 E10.9, E11.9).Hypertension associated  with diabetes ([place]1 each 1   empagliflozin (JARDIANCE) 10 MG TABS tablet Take 1 tablet (10 mg total) by mouth daily. 90 tablet 3   Lancets (ONETOUCH DELICA PLUS [id number] Check blood sugar am fasting and TID before meals. 100 each 3   metoprolol succinate (TOPROL-XL) 50 MG 24 hr tablet Take 1 tablet (50 mg total) by mouth daily. Take with or immediately following a meal. 90 tablet 2   misoprostol (CYTOTEC) 200 MCG tablet Insert four tablets vaginally the night prior to your appointment 4 tablet 1   ONETOUCH ULTRA test strip Check blood sugar fasting am and TID before meals. 100 each 3   triamterene-hydrochlorothiazide (MAXZIDE-25) 37.5-25 MG tablet Take 1 tablet by mouth daily. 90 tablet 1   No current facility-administered medications on file prior to visit.    Social History   Tobacco Use   Smoking status: Former    Types: Cigarettes    Quit date: 09/04/1986    Years since quitting: 34.9   Smokeless tobacco: Never  Vaping Use   Vaping Use: Never used  Substance Use Topics   Alcohol use: Yes    Comment: occassionally   Drug use: Never    Review of Systems  Constitutional:  Negative for chills and fever.  Respiratory:  Negative for  cough.   Cardiovascular:  Negative for chest pain and palpitations.  Gastrointestinal:  Negative for nausea and vomiting.      Objective:    BP 110/70   Pulse 93   Temp 99 F (37.2 C) (Oral)   Ht 4' 11"  (1.499 m)   Wt 256 lb 12.8 oz (116.5 kg)   SpO2 96%   BMI 51.87 kg/m  BP Readings from Last 3 Encounters:  08/05/21 110/70  06/15/21 (!) 150/90  04/25/21 116/74   Wt Readings from Last 3 Encounters:  08/05/21 256 lb 12.8 oz (116.5 kg)  06/15/21 261 lb (118.4 kg)  04/25/21 262 lb 9.6 oz (119.1 kg)    Physical Exam Vitals reviewed.  Constitutional:      Appearance: She is well-developed.  Eyes:     Conjunctiva/sclera: Conjunctivae normal.  Cardiovascular:     Rate and Rhythm: Normal rate and regular rhythm.     Pulses:  Normal pulses.     Heart sounds: Normal heart sounds.  Pulmonary:     Effort: Pulmonary effort is normal.     Breath sounds: Normal breath sounds. No wheezing, rhonchi or rales.  Skin:    General: Skin is warm and dry.  Neurological:     Mental Status: She is alert.  Psychiatric:        Speech: Speech normal.        Behavior: Behavior normal.        Thought Content: Thought content normal.        Assessment & Plan:   Problem List Items Addressed This Visit       Cardiovascular and Mediastinum   Hypertension associated with diabetes ([place]) - Primary    Chronic, stable.  Continue Maxide 37.5-25 mg, amlodipine 22m, metoprolol 50 mg daily      Relevant Orders   POCT HgB A1C (Completed)     Endocrine   Diabetes mellitus without complication (HCC)    Lab Results  Component Value Date   HGBA1C 6.6 (A) 08/05/2021  Excellent control.  Congratulated patient.  Continue Jardiance 10 mg.  Pending microalbumin as would like for patient to be on ARB.  Will await urine result        Other   Hyperlipidemia    Excellent control. Continue lipitor 111m  Lab Results  Component Value Date   LDLCALC 65 04/25/2021            I am having SeJohny Shearsaintain her blood glucose meter kit and supplies, triamterene-hydrochlorothiazide, metoprolol succinate, atorvastatin, amLODipine, empagliflozin, OneTouch Ultra, OneTouch Delica Plus [id number] misoprostol.   No orders of the defined types were placed in this encounter.   Return precautions given.   Risks, benefits, and alternatives of the medications and treatment plan prescribed today were discussed, and patient expressed understanding.   Education regarding symptom management and diagnosis given to patient on AVS.  Continue to follow with ArBurnard HawthorneFNP for routine health maintenance.   SeJohny Shearsnd I agreed with plan.   MaMable ParisFNP

## 2021-08-05 NOTE — Progress Notes (Signed)
Poct A1C patient will schedule eye exam. Declines shingles VAC

## 2021-08-05 NOTE — Patient Instructions (Signed)
Please leave urine when you can  Nice to meet you!

## 2021-08-05 NOTE — Progress Notes (Signed)
Discussed during OV. Please see OV notes

## 2021-11-08 ENCOUNTER — Other Ambulatory Visit: Payer: Self-pay

## 2021-11-08 MED ORDER — MISOPROSTOL 200 MCG PO TABS: ORAL_TABLET | ORAL | 1 refills | Status: DC

## 2021-11-08 NOTE — Telephone Encounter (Signed)
Pt calling needing to pick up her cytotec for her BX that she has scheduled on Monday with [facility], she never went to the pharmacy to pick it up. RX resent for pt

## 2021-11-11 ENCOUNTER — Encounter: Payer: 59 | Admitting: Family

## 2021-11-14 ENCOUNTER — Ambulatory Visit (INDEPENDENT_AMBULATORY_CARE_PROVIDER_SITE_OTHER): Payer: 59 | Admitting: Obstetrics and Gynecology

## 2021-11-14 ENCOUNTER — Encounter: Payer: Self-pay | Admitting: Obstetrics and Gynecology

## 2021-11-14 VITALS — BP 128/90 | Ht 59.0 in | Wt 265.0 lb

## 2021-11-14 DIAGNOSIS — D219 Benign neoplasm of connective and other soft tissue, unspecified: Secondary | ICD-10-CM

## 2021-11-14 MED ORDER — MISOPROSTOL 200 MCG PO TABS: ORAL_TABLET | ORAL | 0 refills | Status: DC

## 2021-11-14 NOTE — Progress Notes (Signed)
    Lynn Hawthorne, FNP   Chief Complaint  Patient presents with   EMB   Pt here for EMB for hyst due to leio/bleeding. Dr. Kenton Kingfisher unable to do EMB 5/22. Pt saw Dr. Elly Modena who gave her cytotec to take night before and pt here for 2nd attempt. I was unable to find cx and do EMB.  Pt to RTO with MD for hyst consult and can try EMB at that time with MD. Rx RF cytotec again to take night before. May not be able to get EMB before hyst due to [facility] and uterus.   N/C to pt today.  Meds ordered this encounter  Medications   misoprostol (CYTOTEC) 200 MCG tablet    Sig: Insert four tablets vaginally the night prior to your appointment    Dispense:  4 tablet    Refill:  0    Order Specific Question:   Supervising Provider    Answer:   Renaldo Reel

## 2021-11-14 NOTE — Patient Instructions (Signed)
I value your feedback and you entrusting us with your care. If you get a [facility] patient survey, I would appreciate you taking the time to let us know about your experience today. Thank you! ? ? ?

## 2021-11-23 ENCOUNTER — Encounter: Payer: Self-pay | Admitting: Family

## 2021-11-23 ENCOUNTER — Ambulatory Visit (INDEPENDENT_AMBULATORY_CARE_PROVIDER_SITE_OTHER): Payer: 59 | Admitting: Family

## 2021-11-23 VITALS — BP 118/70 | HR 90 | Temp 99.4°F | Ht 59.0 in | Wt 259.8 lb

## 2021-11-23 DIAGNOSIS — I152 Hypertension secondary to endocrine disorders: Secondary | ICD-10-CM | POA: Diagnosis not present

## 2021-11-23 DIAGNOSIS — E1159 Type 2 diabetes mellitus with other circulatory complications: Secondary | ICD-10-CM

## 2021-11-23 DIAGNOSIS — E119 Type 2 diabetes mellitus without complications: Secondary | ICD-10-CM | POA: Diagnosis not present

## 2021-11-23 DIAGNOSIS — E785 Hyperlipidemia, unspecified: Secondary | ICD-10-CM | POA: Diagnosis not present

## 2021-11-23 DIAGNOSIS — Z0000 Encounter for general adult medical examination without abnormal findings: Secondary | ICD-10-CM

## 2021-11-23 DIAGNOSIS — Z780 Asymptomatic menopausal state: Secondary | ICD-10-CM | POA: Diagnosis not present

## 2021-11-23 LAB — POCT GLYCOSYLATED HEMOGLOBIN (HGB A1C): Hemoglobin A1C: 7 % — AB (ref 4.0–5.6)

## 2021-11-23 NOTE — Assessment & Plan Note (Signed)
Elevated from prior and she endorses dietary indiscretion.  She  prefers to work on dietary changes.  We will continue Jardiance 10 mg at this time.

## 2021-11-23 NOTE — Assessment & Plan Note (Signed)
Lab Results  Component Value Date   LDLCALC 65 04/25/2021   Excellent control.  Continue Lipitor 10 mg.

## 2021-11-23 NOTE — Progress Notes (Signed)
Subjective:    Patient ID: Lynn Hughes, female    DOB: 1960-12-15, 61 y.o.   MRN: [id number]  CC: Lynn Hughes is a 61 y.o. female who presents today for physical exam.    HPI: Feels well today No new complaints    She is following with Dr. Marcelline Mates for uterine fibroids, vaginal bleeding  HTN-compliant with amlodipine 10 mg, metoprolol succinate 52m QD, triamterene hydrochlorothiazide 37.5-25 mg. No cp.   Diabetes-compliant with Jardiance 10 mg.  She endorses dietary indiscretion of late  Hyperlipidemia-compliant with Lipitor 10 mg  Colorectal Cancer Screening: due.  Breast Cancer Screening: Mammogram due Cervical Cancer Screening: UTD, 10/27/19, negative HPV, malignancy Bone Health screening/DEXA for 65+: No increased fracture risk. Defer screening at this time.  Lung Cancer Screening: Doesn't have 20 year pack year history and age > 570years yo 89years        Tetanus - due        Pneumococcal - Candidate for PCV20 due to DM  Labs: Screening labs today. Exercise: No regular exercise.   Alcohol use:  occasional Smoking/tobacco use: former smoker, quit 1988   HISTORY:  Past Medical History:  Diagnosis Date   Allergy    Anemia    Anxiety    Arthritis    Diabetes mellitus without complication ([place]    Fibroids    Heart murmur    Hypertension     Past Surgical History:  Procedure Laterality Date   BOWEL RESECTION N/A 08/22/2018   Procedure: SMALL BOWEL RESECTION;  Surgeon: PJules Husbands MD;  Location: [facility] ORS;  Service: General;  Laterality: N/A;   BREAST EXCISIONAL BIOPSY Left 1995   surgical exc calcs neg    VENTRAL HERNIA REPAIR N/A 08/22/2018   Procedure: HERNIA REPAIR VENTRAL ADULT;  Surgeon: PJules Husbands MD;  Location: [facility] ORS;  Service: General;  Laterality: N/A;   Family History  Problem Relation Age of Onset   Prostate cancer Father    Uterine cancer Maternal Aunt 451  Cancer Paternal Aunt        not sure   Heart attack Paternal Uncle         x2 uncles   Cirrhosis Maternal Grandmother    Endometrial cancer Sister    Fibroids Sister       ALLERGIES: Ace inhibitors and Codeine  Current Outpatient Medications on File Prior to Visit  Medication Sig Dispense Refill   amLODipine (NORVASC) 10 MG tablet Take 1 tablet (10 mg total) by mouth daily. 90 tablet 3   atorvastatin (LIPITOR) 10 MG tablet Take 1 tablet (10 mg total) by mouth daily. 90 tablet 2   blood glucose meter kit and supplies Dispense based on patient and insurance preference. Check fasting am glucose as well as before meals for total four times daily.  (FOR ICD-10 E10.9, E11.9).Hypertension associated with diabetes ([place] 1 each 1   empagliflozin (JARDIANCE) 10 MG TABS tablet Take 1 tablet (10 mg total) by mouth daily. 90 tablet 3   Lancets (ONETOUCH DELICA PLUS [id number] MISC Check blood sugar am fasting and TID before meals. 100 each 3   metoprolol succinate (TOPROL-XL) 50 MG 24 hr tablet Take 1 tablet (50 mg total) by mouth daily. Take with or immediately following a meal. 90 tablet 2   misoprostol (CYTOTEC) 200 MCG tablet Insert four tablets vaginally the night prior to your appointment 4 tablet 0   ONETOUCH ULTRA test strip Check blood sugar fasting am and TID before meals.  100 each 3   triamterene-hydrochlorothiazide (MAXZIDE-25) 37.5-25 MG tablet Take 1 tablet by mouth daily. 90 tablet 1   No current facility-administered medications on file prior to visit.    Social History   Tobacco Use   Smoking status: Former    Types: Cigarettes    Quit date: 09/04/1986    Years since quitting: 35.2   Smokeless tobacco: Never  Vaping Use   Vaping Use: Never used  Substance Use Topics   Alcohol use: Yes    Comment: occassionally   Drug use: Never    Review of Systems  Constitutional:  Negative for chills, fever and unexpected weight change.  HENT:  Negative for congestion.   Respiratory:  Negative for cough.   Cardiovascular:  Negative for chest pain,  palpitations and leg swelling.  Gastrointestinal:  Negative for nausea and vomiting.  Genitourinary:  Positive for pelvic pain and vaginal bleeding.  Musculoskeletal:  Negative for arthralgias and myalgias.  Skin:  Negative for rash.  Neurological:  Negative for headaches.  Hematological:  Negative for adenopathy.  Psychiatric/Behavioral:  Negative for confusion.       Objective:    BP 118/70   Pulse 90   Temp 99.4 F (37.4 C) (Oral)   Ht 4' 11"  (1.499 m)   Wt 259 lb 12.8 oz (117.8 kg)   SpO2 95%   BMI 52.47 kg/m   BP Readings from Last 3 Encounters:  11/23/21 118/70  11/14/21 (!) 128/90  08/05/21 110/70   Wt Readings from Last 3 Encounters:  11/23/21 259 lb 12.8 oz (117.8 kg)  11/14/21 265 lb (120.2 kg)  08/05/21 256 lb 12.8 oz (116.5 kg)    Physical Exam Vitals reviewed.  Constitutional:      Appearance: Normal appearance. She is well-developed.  Eyes:     Conjunctiva/sclera: Conjunctivae normal.  Neck:     Thyroid: No thyroid mass or thyromegaly.  Cardiovascular:     Rate and Rhythm: Normal rate and regular rhythm.     Pulses: Normal pulses.     Heart sounds: Normal heart sounds.  Pulmonary:     Effort: Pulmonary effort is normal.     Breath sounds: Normal breath sounds. No wheezing, rhonchi or rales.  Chest:  Breasts:    Breasts are symmetrical.     Right: No inverted nipple, mass, nipple discharge, skin change or tenderness.     Left: No inverted nipple, mass, nipple discharge, skin change or tenderness.  Abdominal:     General: Bowel sounds are normal. There is no distension.     Palpations: Abdomen is soft. Abdomen is not rigid. There is no fluid wave or mass.     Tenderness: There is no abdominal tenderness. There is no guarding or rebound.  Lymphadenopathy:     Head:     Right side of head: No submental, submandibular, tonsillar, preauricular, posterior auricular or occipital adenopathy.     Left side of head: No submental, submandibular,  tonsillar, preauricular, posterior auricular or occipital adenopathy.     Cervical: No cervical adenopathy.     Right cervical: No superficial, deep or posterior cervical adenopathy.    Left cervical: No superficial, deep or posterior cervical adenopathy.  Skin:    General: Skin is warm and dry.  Neurological:     Mental Status: She is alert.  Psychiatric:        Speech: Speech normal.        Behavior: Behavior normal.        Thought  Content: Thought content normal.        Assessment & Plan:   Problem List Items Addressed This Visit       Cardiovascular and Mediastinum   Hypertension associated with diabetes (HCC)    Chronic, stable.  Continue amlodipine 10 mg, metoprolol succinate 49m QD, triamterene hydrochlorothiazide 37.5-25 mg.      Relevant Orders   CBC with Differential/Platelet   Comprehensive metabolic panel     Endocrine   Diabetes mellitus without complication ([place] - Primary    Elevated from prior and she endorses dietary indiscretion.  She  prefers to work on dietary changes.  We will continue Jardiance 10 mg at this time.      Relevant Orders   CBC with Differential/Platelet   Comprehensive metabolic panel   POCT HgB A1C (Completed)   Microalbumin / creatinine urine ratio     Other   Hyperlipidemia    Lab Results  Component Value Date   LDLCALC 65 04/25/2021  Excellent control.  Continue Lipitor 10 mg.      Routine physical examination    Deferred pelvic exam as patient is following with GYN for fibroids.  Her Pap smear is up-to-date.  Colonoscopy is due however patient politely declines at this time as she would like to focus on uterine fibroids.  She will let me know when she is ready for referral so that I can place.  Patient will schedule mammogram as it is overdue.  She  declines PCV 20, Tdap vaccine in our office today.  She like to do this at a later date.  Encouraged starting exercise program      Other Visit Diagnoses     Asymptomatic  menopausal state       Relevant Orders   VITAMIN D 25 Hydroxy (Vit-D Deficiency, Fractures)        I am having Lamanda Peace maintain her blood glucose meter kit and supplies, triamterene-hydrochlorothiazide, metoprolol succinate, atorvastatin, amLODipine, empagliflozin, OneTouch Ultra, OneTouch Delica Plus [id number] and misoprostol.   No orders of the defined types were placed in this encounter.   Return precautions given.   Risks, benefits, and alternatives of the medications and treatment plan prescribed today were discussed, and patient expressed understanding.   Education regarding symptom management and diagnosis given to patient on AVS.   Continue to follow with ABurnard Hawthorne FNP for routine health maintenance.   SJohny Shearsand I agreed with plan.   MMable Paris FNP

## 2021-11-23 NOTE — Progress Notes (Signed)
Discussed during OV. Please see OV notes

## 2021-11-23 NOTE — Patient Instructions (Addendum)
Please call  and schedule your 3D mammogram and /or bone density scan as we discussed.   [facility]  ( new location in 2023)  [address], [place], [place] [address]  [place]  You are due for pneumonia vaccine, PCV20                            Tetanus vaccine, Tdap   Nice to see you!  Health Maintenance for Postmenopausal Women Menopause is a normal process in which your ability to get pregnant comes to an end. This process happens slowly over many months or years, usually between the ages of 63 and 41. Menopause is complete when you have missed your menstrual period for 12 months. It is important to talk with your health care provider about some of the most common conditions that affect women after menopause (postmenopausal women). These include heart disease, cancer, and bone loss (osteoporosis). Adopting a healthy lifestyle and getting preventive care can help to promote your health and wellness. The actions you take can also lower your chances of developing some of these common conditions. What are the signs and symptoms of menopause? During menopause, you may have the following symptoms: Hot flashes. These can be moderate or severe. Night sweats. Decrease in sex drive. Mood swings. Headaches. Tiredness (fatigue). Irritability. Memory problems. Problems falling asleep or staying asleep. Talk with your health care provider about treatment options for your symptoms. Do I need hormone replacement therapy? Hormone replacement therapy is effective in treating symptoms that are caused by menopause, such as hot flashes and night sweats. Hormone replacement carries certain risks, especially as you become older. If you are thinking about using estrogen or estrogen with progestin, discuss the benefits and risks with your health care provider. How can I reduce my risk for heart disease and stroke? The risk of heart disease, heart attack, and  stroke increases as you age. One of the causes may be a change in the body's hormones during menopause. This can affect how your body uses dietary fats, triglycerides, and cholesterol. Heart attack and stroke are medical emergencies. There are many things that you can do to help prevent heart disease and stroke. Watch your blood pressure High blood pressure causes heart disease and increases the risk of stroke. This is more likely to develop in people who have high blood pressure readings or are overweight. Have your blood pressure checked: Every 3-5 years if you are 63-53 years of age. Every year if you are 63 years old or older. Eat a healthy diet  Eat a diet that includes plenty of vegetables, fruits, low-fat dairy products, and lean protein. Do not eat a lot of foods that are high in solid fats, added sugars, or sodium. Get regular exercise Get regular exercise. This is one of the most important things you can do for your health. Most adults should: Try to exercise for at least 150 minutes each week. The exercise should increase your heart rate and make you sweat (moderate-intensity exercise). Try to do strengthening exercises at least twice each week. Do these in addition to the moderate-intensity exercise. Spend less time sitting. Even light physical activity can be beneficial. Other tips Work with your health care provider to achieve or maintain a healthy weight. Do not use any products that contain nicotine or tobacco. These products include cigarettes, chewing tobacco, and vaping devices, such as e-cigarettes. If you need help  quitting, ask your health care provider. Know your numbers. Ask your health care provider to check your cholesterol and your blood sugar (glucose). Continue to have your blood tested as directed by your health care provider. Do I need screening for cancer? Depending on your health history and family history, you may need to have cancer screenings at different  stages of your life. This may include screening for: Breast cancer. Cervical cancer. Lung cancer. Colorectal cancer. What is my risk for osteoporosis? After menopause, you may be at increased risk for osteoporosis. Osteoporosis is a condition in which bone destruction happens more quickly than new bone creation. To help prevent osteoporosis or the bone fractures that can happen because of osteoporosis, you may take the following actions: If you are 63-84 years old, get at least 1,000 mg of calcium and at least 600 international units (IU) of vitamin D per day. If you are older than age 63 but younger than age 59, get at least 1,200 mg of calcium and at least 600 international units (IU) of vitamin D per day. If you are older than age 63, get at least 1,200 mg of calcium and at least 800 international units (IU) of vitamin D per day. Smoking and drinking excessive alcohol increase the risk of osteoporosis. Eat foods that are rich in calcium and vitamin D, and do weight-bearing exercises several times each week as directed by your health care provider. How does menopause affect my mental health? Depression may occur at any age, but it is more common as you become older. Common symptoms of depression include: Feeling depressed. Changes in sleep patterns. Changes in appetite or eating patterns. Feeling an overall lack of motivation or enjoyment of activities that you previously enjoyed. Frequent crying spells. Talk with your health care provider if you think that you are experiencing any of these symptoms. General instructions See your health care provider for regular wellness exams and vaccines. This may include: Scheduling regular health, dental, and eye exams. Getting and maintaining your vaccines. These include: Influenza vaccine. Get this vaccine each year before the flu season begins. Pneumonia vaccine. Shingles vaccine. Tetanus, diphtheria, and pertussis (Tdap) booster vaccine. Your  health care provider may also recommend other immunizations. Tell your health care provider if you have ever been abused or do not feel safe at home. Summary Menopause is a normal process in which your ability to get pregnant comes to an end. This condition causes hot flashes, night sweats, decreased interest in sex, mood swings, headaches, or lack of sleep. Treatment for this condition may include hormone replacement therapy. Take actions to keep yourself healthy, including exercising regularly, eating a healthy diet, watching your weight, and checking your blood pressure and blood sugar levels. Get screened for cancer and depression. Make sure that you are up to date with all your vaccines. This information is not intended to replace advice given to you by your health care provider. Make sure you discuss any questions you have with your health care provider. Document Revised: 06/14/2020 Document Reviewed: 06/14/2020 Elsevier Patient Education  [place].

## 2021-11-23 NOTE — Assessment & Plan Note (Signed)
Chronic, stable.  Continue amlodipine 10 mg, metoprolol succinate 50mg QD, triamterene hydrochlorothiazide 37.5-25 mg. 

## 2021-11-23 NOTE — Assessment & Plan Note (Signed)
Deferred pelvic exam as patient is following with GYN for fibroids.  Her Pap smear is up-to-date.  Colonoscopy is due however patient politely declines at this time as she would like to focus on uterine fibroids.  She will let me know when she is ready for referral so that I can place.  Patient will schedule mammogram as it is overdue.  She  declines PCV 20, Tdap vaccine in our office today.  She like to do this at a later date.  Encouraged starting exercise program

## 2021-11-24 ENCOUNTER — Encounter: Payer: Self-pay | Admitting: Obstetrics and Gynecology

## 2021-11-24 ENCOUNTER — Ambulatory Visit: Payer: 59 | Admitting: Obstetrics and Gynecology

## 2021-11-24 VITALS — BP 140/84 | HR 97 | Ht 59.0 in | Wt 260.0 lb

## 2021-11-24 DIAGNOSIS — D219 Benign neoplasm of connective and other soft tissue, unspecified: Secondary | ICD-10-CM

## 2021-11-24 DIAGNOSIS — N924 Excessive bleeding in the premenopausal period: Secondary | ICD-10-CM | POA: Diagnosis not present

## 2021-11-24 LAB — CBC WITH DIFFERENTIAL/PLATELET
Basophils Absolute: 0.1 10*3/uL (ref 0.0–0.1)
Basophils Relative: 0.9 % (ref 0.0–3.0)
Eosinophils Absolute: 0.2 10*3/uL (ref 0.0–0.7)
Eosinophils Relative: 1.8 % (ref 0.0–5.0)
HCT: 42.7 % (ref 36.0–46.0)
Hemoglobin: 13.7 g/dL (ref 12.0–15.0)
Lymphocytes Relative: 11.2 % — ABNORMAL LOW (ref 12.0–46.0)
Lymphs Abs: 1 10*3/uL (ref 0.7–4.0)
MCHC: 32.1 g/dL (ref 30.0–36.0)
MCV: 83.2 fl (ref 78.0–100.0)
Monocytes Absolute: 0.4 10*3/uL (ref 0.1–1.0)
Monocytes Relative: 4.5 % (ref 3.0–12.0)
Neutro Abs: 7.5 10*3/uL (ref 1.4–7.7)
Neutrophils Relative %: 81.6 % — ABNORMAL HIGH (ref 43.0–77.0)
Platelets: 365 10*3/uL (ref 150.0–400.0)
RBC: 5.14 Mil/uL — ABNORMAL HIGH (ref 3.87–5.11)
RDW: 18.7 % — ABNORMAL HIGH (ref 11.5–15.5)
WBC: 9.1 10*3/uL (ref 4.0–10.5)

## 2021-11-24 LAB — COMPREHENSIVE METABOLIC PANEL
ALT: 8 U/L (ref 0–35)
AST: 10 U/L (ref 0–37)
Albumin: 4.2 g/dL (ref 3.5–5.2)
Alkaline Phosphatase: 76 U/L (ref 39–117)
BUN: 20 mg/dL (ref 6–23)
CO2: 32 mEq/L (ref 19–32)
Calcium: 10.1 mg/dL (ref 8.4–10.5)
Chloride: 99 mEq/L (ref 96–112)
Creatinine, Ser: 0.96 mg/dL (ref 0.40–1.20)
GFR: 63.14 mL/min (ref >60.00)
Glucose, Bld: 119 mg/dL — ABNORMAL HIGH (ref 70–99)
Potassium: 4 mEq/L (ref 3.5–5.1)
Sodium: 141 mEq/L (ref 135–145)
Total Bilirubin: 0.3 mg/dL (ref 0.2–1.2)
Total Protein: 7.4 g/dL (ref 6.0–8.3)

## 2021-11-24 LAB — VITAMIN D 25 HYDROXY (VIT D DEFICIENCY, FRACTURES): VITD: 31.27 ng/mL (ref 30.00–100.00)

## 2021-11-24 MED ORDER — NORETHINDRONE ACETATE 5 MG PO TABS: 5.0000 mg | ORAL_TABLET | Freq: Every day | ORAL | 2 refills | Status: DC

## 2021-11-24 NOTE — Progress Notes (Signed)
GYNECOLOGY PROGRESS NOTE  Subjective:    Patient ID: Lynn Hughes, female    DOB: 1960-08-14, 61 y.o.   MRN: [id number]  HPI  Patient is a 61 y.o. [id number] female who presents for intermenstrual spotting and enlarged fibroid uterus.  She reports that she is still having regular cycles, using 3-4 pads daily, lasts 3-4 days, moderate cramping. Cycles were previously like clockwork. Cycles are now slightly irregular, sometimes spotting occurs 2-3 weeks before cycle.Marland Kitchen Has had the spotting issue for the past 10 years Has had fibroids for 30 years. Reports endometrial biopsy x 2 attempted without success. Was referred for third attempt and further management. LMP: patient cannot recall, unsure what is her normal cycle anymore, but does report recent episode of bleeding after Cytotec administration ~2 weeks ago. Was told that she needed a hysterectomy.    The following portions of the patient's history were reviewed and updated as appropriate: She  has a past medical history of Allergy, Anemia, Anxiety, Arthritis, Diabetes mellitus without complication ([place]), Fibroids, Heart murmur, and Hypertension.  She  has a past surgical history that includes Ventral hernia repair (N/A, 08/22/2018); Bowel resection (N/A, 08/22/2018); and Breast excisional biopsy (Left, 1995).  Her family history includes Cancer in her paternal aunt; Cirrhosis in her maternal grandmother; Endometrial cancer in her sister; Fibroids in her sister; Heart attack in her paternal uncle; Prostate cancer in her father; Uterine cancer (age of onset: 76) in her maternal aunt.  She  reports that she quit smoking about 35 years ago. Her smoking use included cigarettes. She has never used smokeless tobacco. She reports current alcohol use. She reports that she does not use drugs.  Current Outpatient Medications on File Prior to Visit  Medication Sig Dispense Refill   amLODipine (NORVASC) 10 MG tablet Take 1 tablet (10 mg total) by mouth  daily. 90 tablet 3   atorvastatin (LIPITOR) 10 MG tablet Take 1 tablet (10 mg total) by mouth daily. 90 tablet 2   blood glucose meter kit and supplies Dispense based on patient and insurance preference. Check fasting am glucose as well as before meals for total four times daily.  (FOR ICD-10 E10.9, E11.9).Hypertension associated with diabetes ([place]) 1 each 1   empagliflozin (JARDIANCE) 10 MG TABS tablet Take 1 tablet (10 mg total) by mouth daily. 90 tablet 3   Lancets (ONETOUCH DELICA PLUS [id number]) MISC Check blood sugar am fasting and TID before meals. 100 each 3   metoprolol succinate (TOPROL-XL) 50 MG 24 hr tablet Take 1 tablet (50 mg total) by mouth daily. Take with or immediately following a meal. 90 tablet 2   misoprostol (CYTOTEC) 200 MCG tablet Insert four tablets vaginally the night prior to your appointment 4 tablet 0   ONETOUCH ULTRA test strip Check blood sugar fasting am and TID before meals. 100 each 3   triamterene-hydrochlorothiazide (MAXZIDE-25) 37.5-25 MG tablet Take 1 tablet by mouth daily. 90 tablet 1   No current facility-administered medications on file prior to visit.   She is allergic to ace inhibitors and codeine..  Review of Systems Pertinent items noted in HPI and remainder of comprehensive ROS otherwise negative.   Objective:  Blood pressure (!) 140/84, pulse 97, height 4' 11"  (1.499 m), weight 260 lb (117.9 kg).  Body mass index is 52.51 kg/m.  General appearance: alert and no distress Abdomen: normal findings: bowel sounds normal and soft and abnormal findings:  mass, located in the lower abdomen, arising from the pelvis, ~ 3-4  cm above umbilicus, mobile.  Pelvic: external genitalia normal, rectovaginal septum normal.  Vagina without discharge.  Cervix displaced anteriorly, retropubic.  Unable to visualize on speculum exam but palpable high in the vaginal canal.  Uterus mobile, nontender, normal shape and size.  Adnexae non-palpable, nontender bilaterally.   Extremities: extremities normal, atraumatic, no cyanosis or edema Neurologic: Grossly normal  Labs:   Latest Reference Range & Units 10/27/19 14:12  LH mIU/mL 19.4  FSH mIU/mL 27.8    Imaging:  US PELVIS (TRANSABDOMINAL ONLY) Patient Name: Lynn Hughes DOB: May 21, 1960 MRN: [id number]  ULTRASOUND REPORT  Location: [facility]  Date of Service: 06/09/2021   Indications:Abnormal Uterine Bleeding; known fibroids Findings:  The uterus is anteverted and measures 22 x 23 x 17 cm. Echo texture is heterogenous with evidence of focal masses. Within the uterus are multiple suspected fibroids measuring: Fibroid 1:  15 cm submucosal, this measures slightly smaller than 1 year  ago, however with it's sized and limited visibility,  this is likely due  to technical margin of error only. Fibroid 2:   4.6 cm calcified, mid body  The Endometrium is not visualized due to Fibroids.  Ovaries are not visualized. Survey of the adnexa demonstrates no adnexal masses. There is no free fluid in the cul de sac.  Impression: 1. Fibroid uterus.  Recommendations: 1.Clinical correlation with the patient's History and Physical Exam.  Vivien Rota  [place]   The ultrasound images and findings were reviewed by me and I agree with  the above report.  Finis Bud, M.D. 06/19/2021 12:33 PM   Assessment:   1. Leiomyoma   2. Abnormal perimenopausal bleeding      Plan:  1. Leiomyoma - Reviewed ultrasound results with patient. Ultrasound noting enlarged fibroid uterus.  Currently with intermenstrual spotting with cycles over the past several years. Endometrial biopsy not performed today due to abnormal positioning of the cervix (displaced anteriorly behind pubic symphysis). Discussed that spotting was most likely secondary to fibroid uterus and perimenopausal status. Discussed recommendations for hysterectomy if symptomatic (abnormal uterine bleeding, symptomatic anemia, pelvic  pressure symptoms, pain).  Patient denies any of these issues at this time. Discussed that although hysterectomy could be considered based on size alone, was not necessary if asymptomatic.   2. Abnormal perimenopausal bleeding - Intermenstrual spotting noted for past 10 years, most likely due to fibroid uterus and likely perimenopausal status (labs from 2019 noting likely perimenopausal status, patient notes that she has never stopped having monthly cycles). Reviewed options for management, including medication (hormonal) vs surgical management with hysterectomy. Patient notes she would like to try medical management for now, but will likely consider hysterectomy in the near future (next few months). Prescribed Aygestin. RTC in 3 months to f/u fibroid uterus and discussion of surgical intervention with hysterectomy.     A total of 38 minutes were spent face-to-face with the patient during this encounter and over half of that time involved counseling and coordination of care.    Rubie Maid, MD [facility]

## 2021-11-25 ENCOUNTER — Other Ambulatory Visit: Payer: Self-pay | Admitting: Family

## 2021-11-25 DIAGNOSIS — E119 Type 2 diabetes mellitus without complications: Secondary | ICD-10-CM

## 2021-12-23 ENCOUNTER — Telehealth: Payer: Self-pay | Admitting: Family

## 2021-12-23 ENCOUNTER — Other Ambulatory Visit: Payer: Self-pay

## 2021-12-23 ENCOUNTER — Other Ambulatory Visit: Payer: Self-pay | Admitting: Family

## 2021-12-23 DIAGNOSIS — E119 Type 2 diabetes mellitus without complications: Secondary | ICD-10-CM

## 2021-12-23 NOTE — Telephone Encounter (Signed)
Patient has lab appt on 11/21 but she just had labs done in October?What labs should I order

## 2021-12-23 NOTE — Telephone Encounter (Signed)
ordered

## 2021-12-23 NOTE — Telephone Encounter (Signed)
Patient as a lab appt 12/27/2021, there are no orders in.

## 2021-12-23 NOTE — Telephone Encounter (Signed)
She needs urine microalbumin and cbc  Please ensure ordered

## 2021-12-27 ENCOUNTER — Other Ambulatory Visit (INDEPENDENT_AMBULATORY_CARE_PROVIDER_SITE_OTHER): Payer: 59

## 2021-12-27 DIAGNOSIS — E119 Type 2 diabetes mellitus without complications: Secondary | ICD-10-CM | POA: Diagnosis not present

## 2021-12-27 LAB — CBC WITH DIFFERENTIAL/PLATELET
Basophils Absolute: 0.2 10*3/uL — ABNORMAL HIGH (ref 0.0–0.1)
Basophils Relative: 2.2 % (ref 0.0–3.0)
Eosinophils Absolute: 0.2 10*3/uL (ref 0.0–0.7)
Eosinophils Relative: 2.4 % (ref 0.0–5.0)
HCT: 42.8 % (ref 36.0–46.0)
Hemoglobin: 13.7 g/dL (ref 12.0–15.0)
Lymphocytes Relative: 15.1 % (ref 12.0–46.0)
Lymphs Abs: 1.3 10*3/uL (ref 0.7–4.0)
MCHC: 32.1 g/dL (ref 30.0–36.0)
MCV: 83.7 fl (ref 78.0–100.0)
Monocytes Absolute: 0.6 10*3/uL (ref 0.1–1.0)
Monocytes Relative: 6.7 % (ref 3.0–12.0)
Neutro Abs: 6.2 10*3/uL (ref 1.4–7.7)
Neutrophils Relative %: 73.6 % (ref 43.0–77.0)
Platelets: 369 10*3/uL (ref 150.0–400.0)
RBC: 5.11 Mil/uL (ref 3.87–5.11)
RDW: 18 % — ABNORMAL HIGH (ref 11.5–15.5)
WBC: 8.4 10*3/uL (ref 4.0–10.5)

## 2021-12-27 LAB — MICROALBUMIN / CREATININE URINE RATIO
Creatinine,U: 126.6 mg/dL
Microalb Creat Ratio: 5.5 mg/g (ref 0.0–30.0)
Microalb, Ur: 6.9 mg/dL — ABNORMAL HIGH (ref 0.0–1.9)

## 2021-12-28 ENCOUNTER — Telehealth: Payer: Self-pay

## 2021-12-28 NOTE — Telephone Encounter (Signed)
LVM to call back to go over results 

## 2022-01-02 MED ORDER — EMPAGLIFLOZIN 25 MG PO TABS: 25.0000 mg | ORAL_TABLET | Freq: Every day | ORAL | 3 refills | Status: DC

## 2022-01-02 NOTE — Addendum Note (Signed)
Addended by: [place], Abundio Teuscher on: 01/02/2022 02:20 PM   Modules accepted: Orders

## 2022-02-18 ENCOUNTER — Other Ambulatory Visit: Payer: Self-pay | Admitting: Obstetrics and Gynecology

## 2022-02-24 ENCOUNTER — Other Ambulatory Visit: Payer: Self-pay

## 2022-02-24 ENCOUNTER — Encounter: Payer: Self-pay | Admitting: Family

## 2022-02-24 ENCOUNTER — Ambulatory Visit: Payer: 59 | Admitting: Family

## 2022-02-24 ENCOUNTER — Telehealth: Payer: Self-pay

## 2022-02-24 VITALS — BP 136/86 | HR 92 | Temp 98.2°F | Ht 59.0 in | Wt 261.0 lb

## 2022-02-24 DIAGNOSIS — E119 Type 2 diabetes mellitus without complications: Secondary | ICD-10-CM

## 2022-02-24 DIAGNOSIS — R7309 Other abnormal glucose: Secondary | ICD-10-CM

## 2022-02-24 DIAGNOSIS — E785 Hyperlipidemia, unspecified: Secondary | ICD-10-CM | POA: Diagnosis not present

## 2022-02-24 DIAGNOSIS — E1159 Type 2 diabetes mellitus with other circulatory complications: Secondary | ICD-10-CM | POA: Diagnosis not present

## 2022-02-24 DIAGNOSIS — I152 Hypertension secondary to endocrine disorders: Secondary | ICD-10-CM

## 2022-02-24 LAB — MICROALBUMIN / CREATININE URINE RATIO
Creatinine,U: 115.9 mg/dL
Microalb Creat Ratio: 18.8 mg/g (ref 0.0–30.0)
Microalb, Ur: 21.8 mg/dL — ABNORMAL HIGH (ref 0.0–1.9)

## 2022-02-24 LAB — POCT GLYCOSYLATED HEMOGLOBIN (HGB A1C): Hemoglobin A1C: 6.7 % — AB (ref 4.0–5.6)

## 2022-02-24 MED ORDER — EMPAGLIFLOZIN 25 MG PO TABS: 25.0000 mg | ORAL_TABLET | Freq: Every day | ORAL | 3 refills | Status: DC

## 2022-02-24 MED ORDER — ATORVASTATIN CALCIUM 10 MG PO TABS: 10.0000 mg | ORAL_TABLET | Freq: Every day | ORAL | 3 refills | Status: DC

## 2022-02-24 MED ORDER — BLOOD GLUCOSE METER KIT: PACK | 1 refills | Status: DC

## 2022-02-24 MED ORDER — METOPROLOL SUCCINATE ER 50 MG PO TB24: 50.0000 mg | ORAL_TABLET | Freq: Every day | ORAL | 3 refills | Status: DC

## 2022-02-24 MED ORDER — TRIAMTERENE-HCTZ 37.5-25 MG PO TABS: 1.0000 | ORAL_TABLET | Freq: Every day | ORAL | 3 refills | Status: DC

## 2022-02-24 MED ORDER — ONETOUCH DELICA PLUS LANCET30G MISC: 3 refills | Status: DC

## 2022-02-24 MED ORDER — AMLODIPINE BESYLATE 10 MG PO TABS: 10.0000 mg | ORAL_TABLET | Freq: Every day | ORAL | 3 refills | Status: DC

## 2022-02-24 NOTE — Assessment & Plan Note (Addendum)
Lab Results  Component Value Date   HGBA1C 6.7 (A) 02/24/2022   Improved.  Counseled patient again on mechanism of action as it relates to Jardiance and risk of dehydration.  Continue Jardiance '25mg'$  qd.  Pending urine microalbumin.

## 2022-02-24 NOTE — Telephone Encounter (Signed)
Patient states Mable Paris, FNP, changed the dose of her empagliflozin (JARDIANCE) 25 MG TABS tablet (from 10 to 25), but since patient had so many pills, she has been doubling up on the 10 MG.  Patient states she is now out of this medication and the pharmacy needs a new prescription for the refill at 25 MG.  I let patient know that we received confirmation from [facility] on [address], that they received this new prescription today at 2pm.  Patient states she will call the pharmacy to verify they received it and let us know if they did not.

## 2022-02-24 NOTE — Progress Notes (Signed)
Assessment & Plan:  Diabetes mellitus without complication [facility]) Assessment & Plan: Lab Results  Component Value Date   HGBA1C 6.7 (A) 02/24/2022   Improved.  Counseled patient again on mechanism of action as it relates to Jardiance and risk of dehydration.  Continue Jardiance '25mg'$  qd.  Pending urine microalbumin.   Orders: -     Empagliflozin; Take 1 tablet (25 mg total) by mouth daily before breakfast.  Dispense: 90 tablet; Refill: 3  Elevated glucose -     POCT glycosylated hemoglobin (Hb A1C) -     Microalbumin / creatinine urine ratio  Hyperlipidemia, unspecified hyperlipidemia type -     Atorvastatin Calcium; Take 1 tablet (10 mg total) by mouth daily.  Dispense: 90 tablet; Refill: 3  Hypertension associated with diabetes ([place]) Assessment & Plan: Chronic, stable.  Continue amlodipine 10 mg, metoprolol succinate '50mg'$  QD, triamterene hydrochlorothiazide 37.5-25 mg.  Orders: -     Triamterene-HCTZ; Take 1 tablet by mouth daily.  Dispense: 90 tablet; Refill: 3 -     Metoprolol Succinate ER; Take 1 tablet (50 mg total) by mouth daily. Take with or immediately following a meal.  Dispense: 90 tablet; Refill: 3     Return precautions given.   Risks, benefits, and alternatives of the medications and treatment plan prescribed today were discussed, and patient expressed understanding.   Education regarding symptom management and diagnosis given to patient on AVS either electronically or printed.  Return in about 3 months (around 05/26/2022).  Mable Paris, FNP  Subjective:    Patient ID: Lynn Hughes, female    DOB: 05-Jan-1961, 62 y.o.   MRN: [id number]  CC: Lynn Hughes is a 62 y.o. female who presents today for follow up.   HPI: Feels well today.  No new complaints She is compliant with increased dose of Jardiance 25 mg    Allergies: Ace inhibitors and Codeine Current Outpatient Medications on File Prior to Visit  Medication Sig Dispense Refill    misoprostol (CYTOTEC) 200 MCG tablet Insert four tablets vaginally the night prior to your appointment 4 tablet 0   norethindrone (AYGESTIN) 5 MG tablet TAKE 1 TABLET (5 MG TOTAL) BY MOUTH DAILY. 30 tablet 2   ONETOUCH ULTRA test strip Check blood sugar fasting am and TID before meals. 100 each 3   No current facility-administered medications on file prior to visit.    Review of Systems  Constitutional:  Negative for chills and fever.  Respiratory:  Negative for cough.   Cardiovascular:  Negative for chest pain and palpitations.  Gastrointestinal:  Negative for nausea and vomiting.      Objective:    BP 136/86   Pulse 92   Temp 98.2 F (36.8 C) (Oral)   Ht '4\' 11"'$  (1.499 m)   Wt 261 lb (118.4 kg)   SpO2 94%   BMI 52.72 kg/m  BP Readings from Last 3 Encounters:  02/24/22 136/86  11/24/21 (!) 140/84  11/23/21 118/70   Wt Readings from Last 3 Encounters:  02/24/22 261 lb (118.4 kg)  11/24/21 260 lb (117.9 kg)  11/23/21 259 lb 12.8 oz (117.8 kg)    Physical Exam Vitals reviewed.  Constitutional:      Appearance: She is well-developed.  Eyes:     Conjunctiva/sclera: Conjunctivae normal.  Cardiovascular:     Rate and Rhythm: Normal rate and regular rhythm.     Pulses: Normal pulses.     Heart sounds: Normal heart sounds.  Pulmonary:     Effort: Pulmonary  effort is normal.     Breath sounds: Normal breath sounds. No wheezing, rhonchi or rales.  Skin:    General: Skin is warm and dry.  Neurological:     Mental Status: She is alert.  Psychiatric:        Speech: Speech normal.        Behavior: Behavior normal.        Thought Content: Thought content normal.

## 2022-02-24 NOTE — Assessment & Plan Note (Signed)
Chronic, stable.  Continue amlodipine 10 mg, metoprolol succinate '50mg'$  QD, triamterene hydrochlorothiazide 37.5-25 mg.

## 2022-02-27 NOTE — Telephone Encounter (Signed)
NOTED

## 2022-02-28 ENCOUNTER — Other Ambulatory Visit: Payer: Self-pay | Admitting: Family

## 2022-03-01 ENCOUNTER — Telehealth: Payer: Self-pay

## 2022-03-01 NOTE — Telephone Encounter (Signed)
LVM to call back to go over results 

## 2022-05-30 ENCOUNTER — Other Ambulatory Visit: Payer: Self-pay | Admitting: Obstetrics and Gynecology

## 2022-06-05 ENCOUNTER — Encounter: Payer: Self-pay | Admitting: Family

## 2022-06-05 ENCOUNTER — Ambulatory Visit: Payer: 59 | Admitting: Family

## 2022-06-05 VITALS — BP 128/78 | HR 86 | Temp 98.6°F | Ht 59.0 in | Wt 258.2 lb

## 2022-06-05 DIAGNOSIS — E1159 Type 2 diabetes mellitus with other circulatory complications: Secondary | ICD-10-CM | POA: Diagnosis not present

## 2022-06-05 DIAGNOSIS — Z7984 Long term (current) use of oral hypoglycemic drugs: Secondary | ICD-10-CM

## 2022-06-05 DIAGNOSIS — E119 Type 2 diabetes mellitus without complications: Secondary | ICD-10-CM

## 2022-06-05 DIAGNOSIS — R7309 Other abnormal glucose: Secondary | ICD-10-CM

## 2022-06-05 DIAGNOSIS — I152 Hypertension secondary to endocrine disorders: Secondary | ICD-10-CM

## 2022-06-05 DIAGNOSIS — Z1231 Encounter for screening mammogram for malignant neoplasm of breast: Secondary | ICD-10-CM

## 2022-06-05 LAB — POCT GLYCOSYLATED HEMOGLOBIN (HGB A1C): Hemoglobin A1C: 6.6 % — AB (ref 4.0–5.6)

## 2022-06-05 MED ORDER — ONETOUCH ULTRA VI STRP: ORAL_STRIP | 3 refills | Status: DC

## 2022-06-05 NOTE — Patient Instructions (Addendum)
Please call  and schedule your 3D mammogram and /or bone density scan as we discussed.   [facility]  ( new location in 2023)  [address], [place], [place] [address]  [place], [place]  [phone]   Let me know about colonoscopy and when you are ready to schedule

## 2022-06-05 NOTE — Progress Notes (Unsigned)
Assessment & Plan:  Diabetes mellitus without complication [facility]) Assessment & Plan: Lab Results  Component Value Date   HGBA1C 6.6 (A) 06/05/2022   Chronic, stable .  Continue Jardiance 25mg  qd.  Pending urine microalbumin. Discussed starting low-dose losartan if microalbuminuria persists.   Orders: -     OneTouch Ultra; Check blood sugar fasting am and TID before meals.  Dispense: 100 each; Refill: 3 -     Microalbumin / creatinine urine ratio; Future -     Lipid panel; Future  Elevated glucose -     POCT glycosylated hemoglobin (Hb A1C)  Hypertension associated with diabetes ([facility]) Assessment & Plan: Chronic, stable.  Continue amlodipine 10 mg, metoprolol succinate 50mg  QD, triamterene hydrochlorothiazide 37.5-25 mg.   Encounter for screening mammogram for malignant neoplasm of breast -     3D Screening Mammogram, Left and Right; Future     Return precautions given.   Risks, benefits, and alternatives of the medications and treatment plan prescribed today were discussed, and patient expressed understanding.   Education regarding symptom management and diagnosis given to patient on AVS either electronically or printed.  Return in about 3 months (around 09/04/2022) for Fasting labs in 2-3 weeks.  Rennie Plowman, FNP  Subjective:    Patient ID: Lynn Hughes, female    DOB: 08-19-1960, 62 y.o.   MRN: [id number]  CC: Lynn Hughes is a 62 y.o. female who presents today for follow up.   HPI: Feels well today.  No new complaints     History of microalbuminemia.  Patient did not start losartan  Declines colonoscopy at this time.    Allergies: Ace inhibitors and Codeine Current Outpatient Medications on File Prior to Visit  Medication Sig Dispense Refill   amLODipine (NORVASC) 10 MG tablet Take 1 tablet (10 mg total) by mouth daily. 90 tablet 3   atorvastatin (LIPITOR) 10 MG tablet Take 1 tablet (10 mg total) by mouth daily. 90 tablet 3   blood glucose meter  kit and supplies Dispense based on patient and insurance preference. Check fasting am glucose as well as before meals for total four times daily.  (FOR ICD-10 E10.9, E11.9).Hypertension associated with diabetes ([facility]) 1 each 1   empagliflozin (JARDIANCE) 25 MG TABS tablet Take 1 tablet (25 mg total) by mouth daily before breakfast. 90 tablet 3   Lancets (ONETOUCH DELICA PLUS LANCET30G) MISC Check blood sugar am fasting and TID before meals. 100 each 3   metoprolol succinate (TOPROL-XL) 50 MG 24 hr tablet Take 1 tablet (50 mg total) by mouth daily. Take with or immediately following a meal. 90 tablet 3   misoprostol (CYTOTEC) 200 MCG tablet Insert four tablets vaginally the night prior to your appointment 4 tablet 0   norethindrone (AYGESTIN) 5 MG tablet TAKE 1 TABLET (5 MG TOTAL) BY MOUTH DAILY. 30 tablet 2   triamterene-hydrochlorothiazide (MAXZIDE-25) 37.5-25 MG tablet Take 1 tablet by mouth daily. 90 tablet 3   No current facility-administered medications on file prior to visit.    Review of Systems  Constitutional:  Negative for chills and fever.  Respiratory:  Negative for cough.   Cardiovascular:  Negative for chest pain and palpitations.  Gastrointestinal:  Negative for nausea and vomiting.      Objective:    BP 128/78   Pulse 86   Temp 98.6 F (37 C) (Oral)   Ht 4\' 11"  (1.499 m)   Wt 258 lb 3.2 oz (117.1 kg)   SpO2 96%   BMI 52.15  kg/m  BP Readings from Last 3 Encounters:  06/05/22 128/78  02/24/22 136/86  11/24/21 (!) 140/84   Wt Readings from Last 3 Encounters:  06/05/22 258 lb 3.2 oz (117.1 kg)  02/24/22 261 lb (118.4 kg)  11/24/21 260 lb (117.9 kg)    Physical Exam Vitals reviewed.  Constitutional:      Appearance: She is well-developed.  Eyes:     Conjunctiva/sclera: Conjunctivae normal.  Cardiovascular:     Rate and Rhythm: Normal rate and regular rhythm.     Pulses: Normal pulses.     Heart sounds: Normal heart sounds.  Pulmonary:     Effort: Pulmonary  effort is normal.     Breath sounds: Normal breath sounds. No wheezing, rhonchi or rales.  Skin:    General: Skin is warm and dry.  Neurological:     Mental Status: She is alert.  Psychiatric:        Speech: Speech normal.        Behavior: Behavior normal.        Thought Content: Thought content normal.

## 2022-06-06 NOTE — Assessment & Plan Note (Signed)
Lab Results  Component Value Date   HGBA1C 6.6 (A) 06/05/2022   Chronic, stable .  Continue Jardiance 25mg  qd.  Pending urine microalbumin. Discussed starting low-dose losartan if microalbuminuria persists.

## 2022-06-06 NOTE — Assessment & Plan Note (Signed)
Chronic, stable.  Continue amlodipine 10 mg, metoprolol succinate 50mg QD, triamterene hydrochlorothiazide 37.5-25 mg. 

## 2022-06-09 ENCOUNTER — Other Ambulatory Visit: Payer: Self-pay | Admitting: Obstetrics and Gynecology

## 2022-06-09 ENCOUNTER — Telehealth: Payer: Self-pay

## 2022-06-13 NOTE — Telephone Encounter (Signed)
noted 

## 2022-06-15 ENCOUNTER — Other Ambulatory Visit: Payer: Self-pay | Admitting: Obstetrics and Gynecology

## 2022-06-19 ENCOUNTER — Other Ambulatory Visit (INDEPENDENT_AMBULATORY_CARE_PROVIDER_SITE_OTHER): Payer: 59

## 2022-06-19 DIAGNOSIS — E119 Type 2 diabetes mellitus without complications: Secondary | ICD-10-CM | POA: Diagnosis not present

## 2022-06-19 LAB — LIPID PANEL
Cholesterol: 127 mg/dL (ref 0–200)
HDL: 40.1 mg/dL (ref >39.00)
LDL Cholesterol: 72 mg/dL (ref 0–99)
NonHDL: 87.12
Total CHOL/HDL Ratio: 3
Triglycerides: 77 mg/dL (ref 0.0–149.0)
VLDL: 15.4 mg/dL (ref 0.0–40.0)

## 2022-06-19 LAB — MICROALBUMIN / CREATININE URINE RATIO
Creatinine,U: 131.1 mg/dL
Microalb Creat Ratio: 24.2 mg/g (ref 0.0–30.0)
Microalb, Ur: 31.8 mg/dL — ABNORMAL HIGH (ref 0.0–1.9)

## 2022-06-23 ENCOUNTER — Telehealth: Payer: Self-pay

## 2022-06-23 NOTE — Telephone Encounter (Signed)
LVM to go over result

## 2022-06-26 NOTE — Telephone Encounter (Signed)
LVM to call back to go over results 

## 2022-06-26 NOTE — Telephone Encounter (Signed)
Pt returning call

## 2022-06-29 NOTE — Telephone Encounter (Signed)
LVM to go over results

## 2022-06-30 MED ORDER — AMLODIPINE BESYLATE 5 MG PO TABS: 5.0000 mg | ORAL_TABLET | Freq: Every day | ORAL | 1 refills | Status: DC

## 2022-06-30 MED ORDER — LOSARTAN POTASSIUM 25 MG PO TABS: 25.0000 mg | ORAL_TABLET | Freq: Every day | ORAL | 1 refills | Status: DC

## 2022-06-30 NOTE — Telephone Encounter (Signed)
Called and spoke with pt.  She is aware of result and change to medications.  [facility] Nephew reported that she will be out of town until June 4th.  She will call to make appointment with Claris Che when she returns home.     Call patient Persistent protein seen in her urine. This is evidence of kidney damage. It is very important to control blood pressure ( goal < 130/80) and diabetes ( goal a1c < 6.5%) to prevent further damage. I would recommend starting losartan 25 mg daily to further protect your kidneys. Please send in if agreeable. In order to compensate for this new blood pressure medication, I would recommend decreasing amlodipine from 10 mg to 5 mg daily   Please send in amlodipine 5mg  if she agrees. Sch one week BP appointment with me.   Cholesterol is excellent

## 2022-06-30 NOTE — Addendum Note (Signed)
Addended by: Benedict Needy on: 06/30/2022 01:34 PM   Modules accepted: Orders

## 2022-07-24 IMAGING — US US PELVIS COMPLETE
1 series · 13 of 25 positions shown · non-contrast
Comparison: Prior ultrasound from 10/29/2019

CLINICAL DATA: Initial evaluation for fibroids.

EXAM:
TRANSABDOMINAL ULTRASOUND OF PELVIS
TECHNIQUE: Transabdominal ultrasound examination of the pelvis was performed
including evaluation of the uterus, ovaries, adnexal regions, and
pelvic cul-de-sac.

[Series 1: us pelvic complete with transvaginal · 45 acquisitions, 13 frames shown]
[im 1/45]
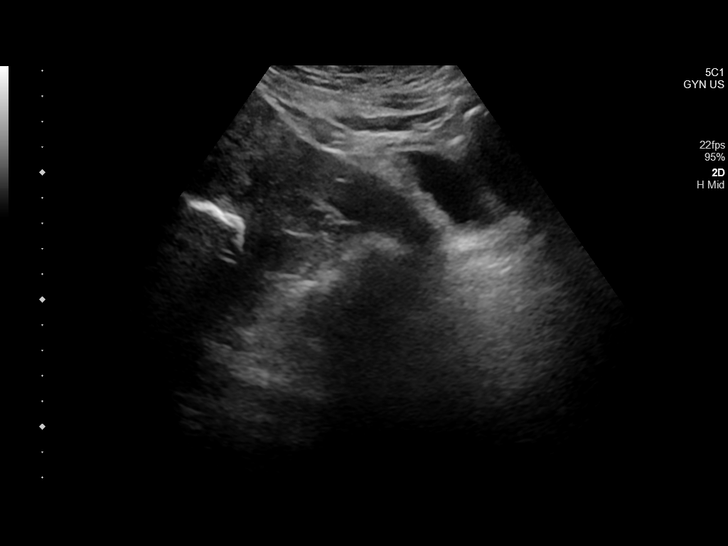
[im 4/45]
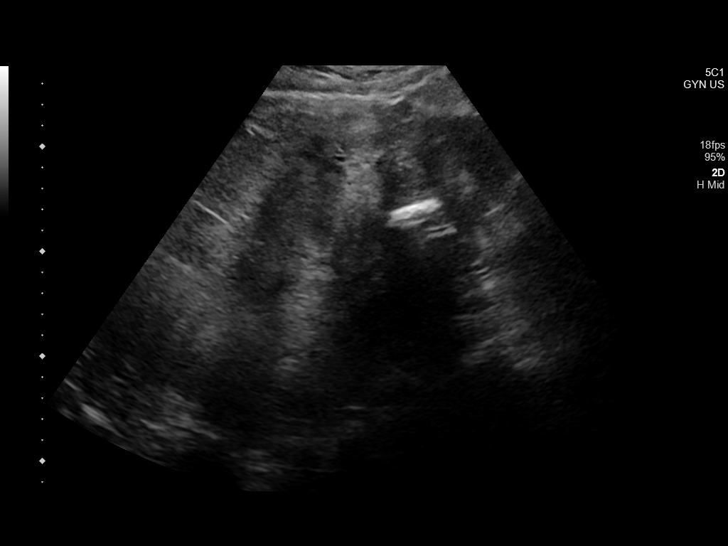
[im 8/45]
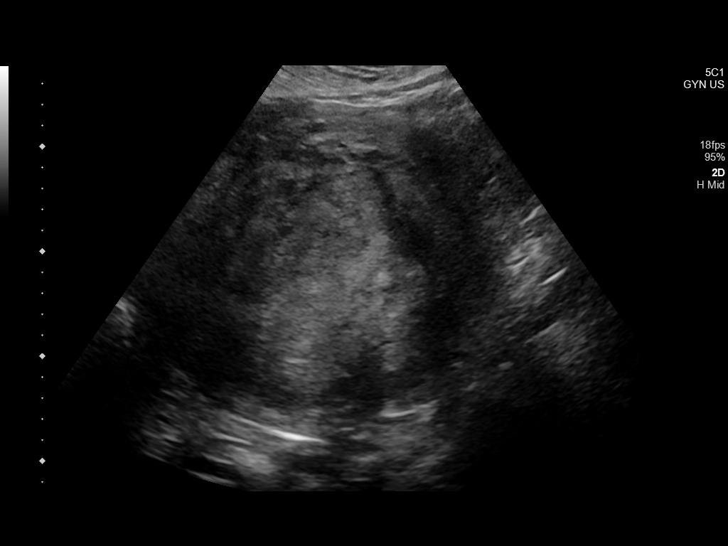
[im 12/45]
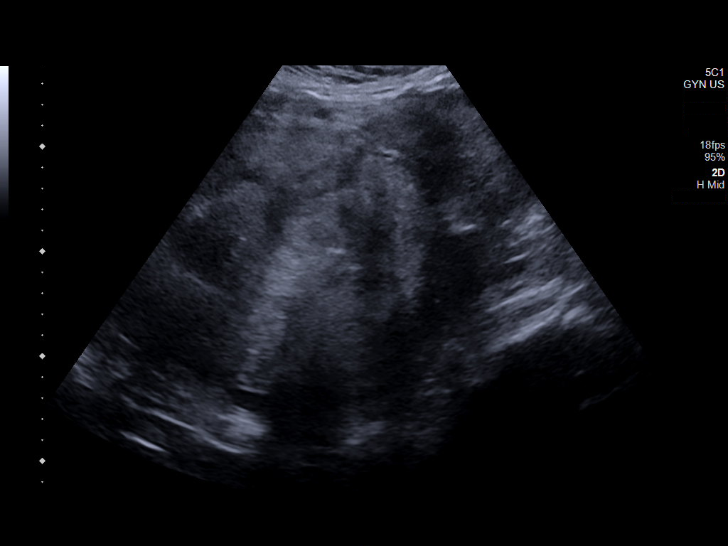
[im 15/45]
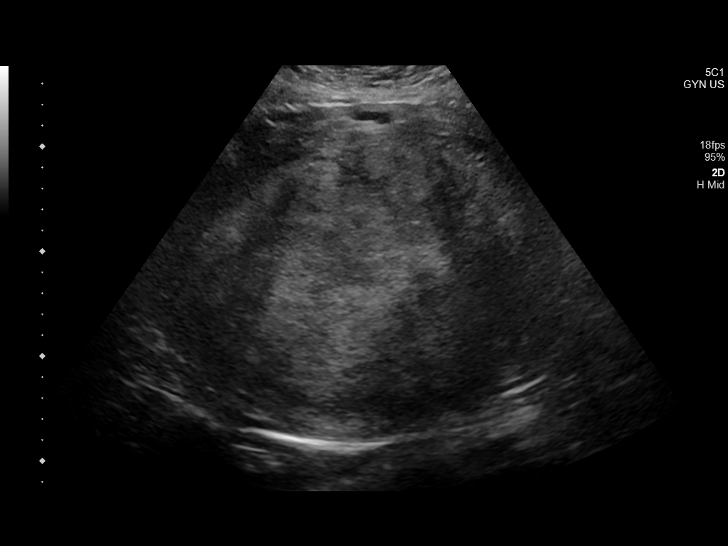
[im 19/45]
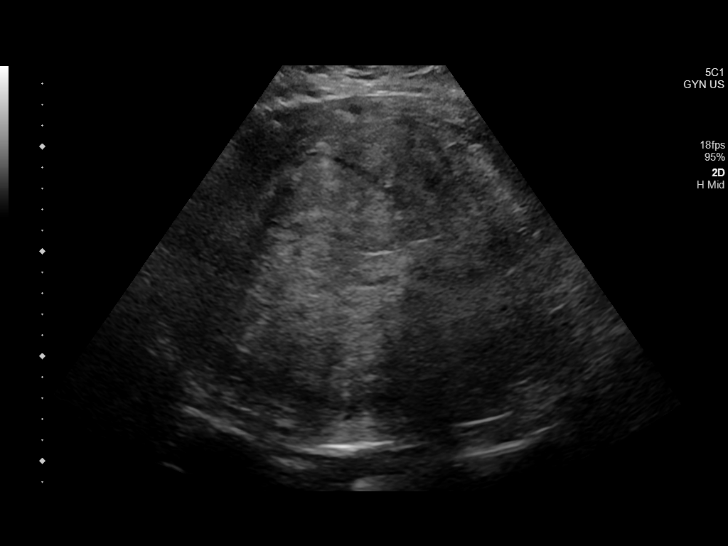
[im 23/45]
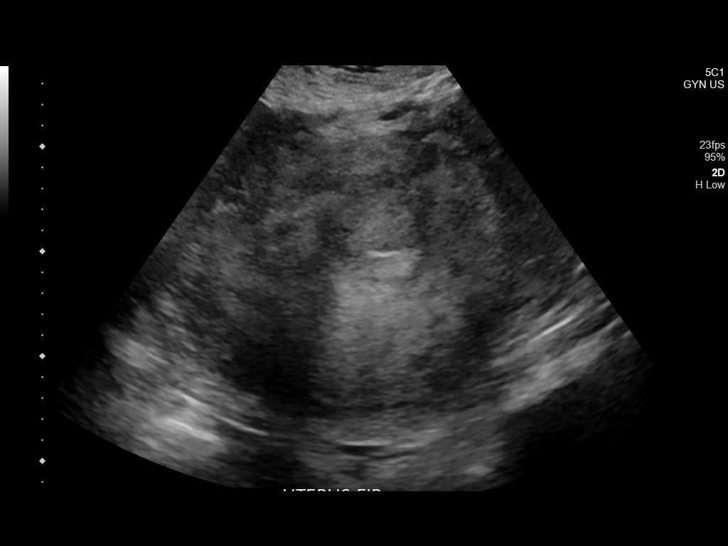
[im 26/45]
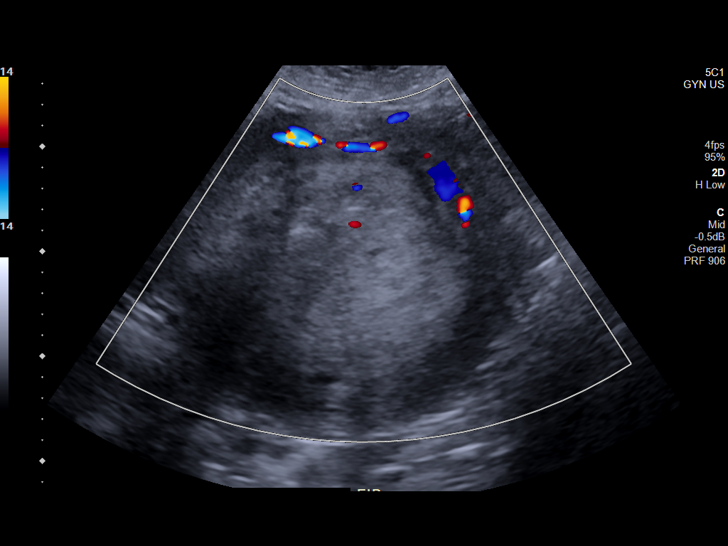
[im 30/45]
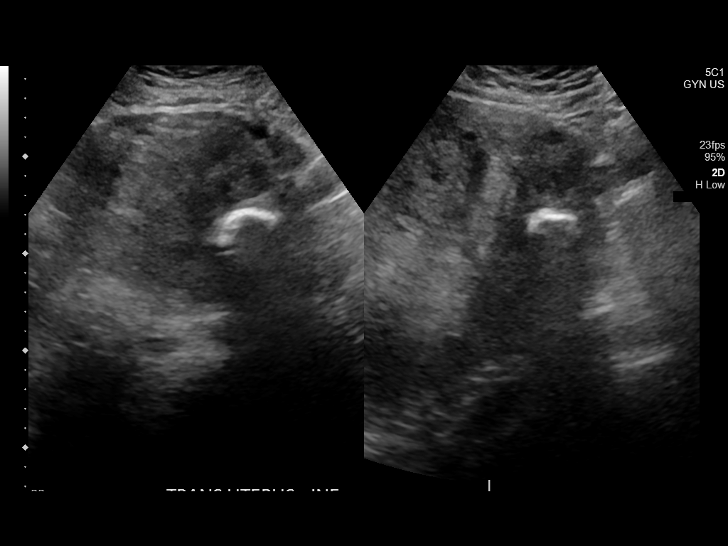
[im 34/45]
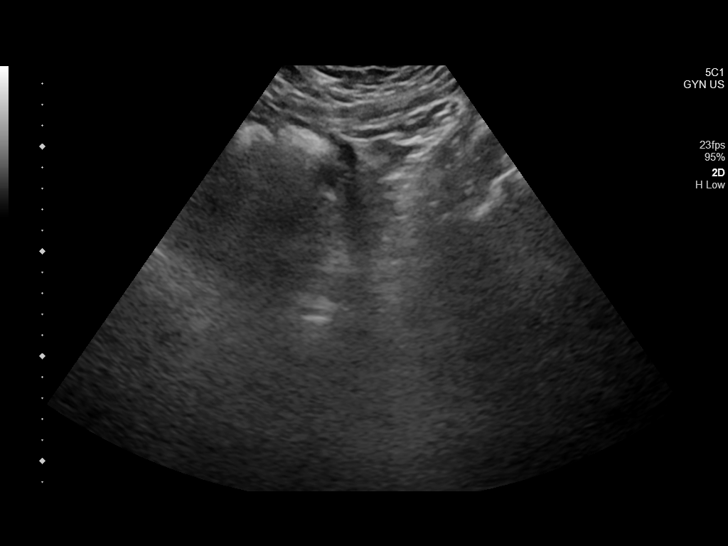
[im 37/45]
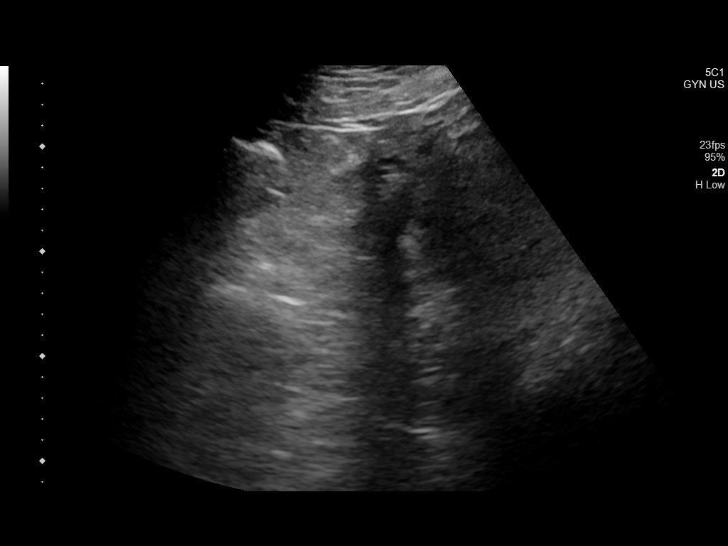
[im 41/45]
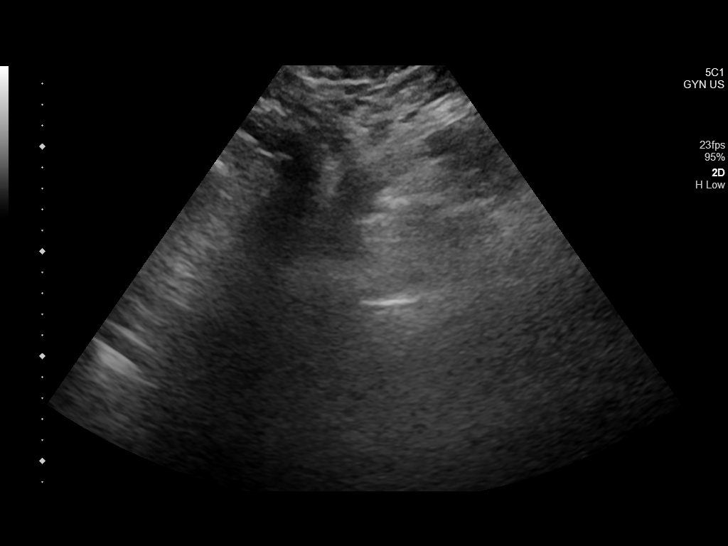
[im 45/45]
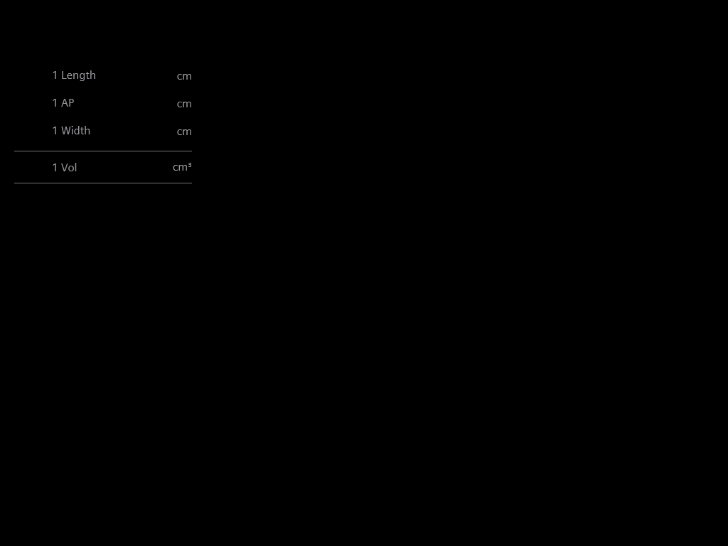

[13 of 25 positions shown; findings below may reference images not displayed]

FINDINGS: Uterus

Measurements: 21.0 x 13.7 x 20.3 cm = volume: 3058.7 mL. Uterus is
anteverted. Large intramural fibroid positioned at the central
uterus measures 17.0 x 14.6 x 16.7 cm. Probable additional partially
calcified intramural fibroid measuring 2.6 x 3.6 x 1.0 cm present at
the left lower uterine segment. Possible additional fibroids are
suspected, difficult to visualize and measure discretely on this
transabdominal only exam.

Endometrium

Obscured by overlying fibroid.

Right ovary

Not visualized.  No adnexal mass.

Left ovary

Not visualized.  No adnexal mass.

Other findings:  No abnormal free fluid.
IMPRESSION: 1. Enlarged fibroid uterus as above, with dominant 17 cm intramural
fibroid occupying the central uterus.
2. Nonvisualization of the endometrium, obscured by overlying
fibroid.
3. Nonvisualization of either ovary. No adnexal mass or free fluid
within the pelvis.

## 2022-09-04 ENCOUNTER — Ambulatory Visit: Payer: 59 | Admitting: Family

## 2022-09-04 ENCOUNTER — Encounter: Payer: Self-pay | Admitting: Family

## 2022-09-04 VITALS — BP 130/70 | HR 70 | Temp 98.2°F | Ht 59.0 in | Wt 252.6 lb

## 2022-09-04 DIAGNOSIS — E785 Hyperlipidemia, unspecified: Secondary | ICD-10-CM | POA: Diagnosis not present

## 2022-09-04 DIAGNOSIS — I152 Hypertension secondary to endocrine disorders: Secondary | ICD-10-CM | POA: Diagnosis not present

## 2022-09-04 DIAGNOSIS — E1159 Type 2 diabetes mellitus with other circulatory complications: Secondary | ICD-10-CM

## 2022-09-04 DIAGNOSIS — E119 Type 2 diabetes mellitus without complications: Secondary | ICD-10-CM | POA: Diagnosis not present

## 2022-09-04 DIAGNOSIS — R7309 Other abnormal glucose: Secondary | ICD-10-CM

## 2022-09-04 DIAGNOSIS — Z7984 Long term (current) use of oral hypoglycemic drugs: Secondary | ICD-10-CM

## 2022-09-04 LAB — COMPREHENSIVE METABOLIC PANEL
ALT: 8 U/L (ref 0–35)
AST: 11 U/L (ref 0–37)
Albumin: 4 g/dL (ref 3.5–5.2)
Alkaline Phosphatase: 70 U/L (ref 39–117)
BUN: 20 mg/dL (ref 6–23)
CO2: 31 mEq/L (ref 19–32)
Calcium: 9.9 mg/dL (ref 8.4–10.5)
Chloride: 98 mEq/L (ref 96–112)
Creatinine, Ser: 0.86 mg/dL (ref 0.40–1.20)
GFR: 71.66 mL/min (ref >60.00)
Glucose, Bld: 136 mg/dL — ABNORMAL HIGH (ref 70–99)
Potassium: 3.7 mEq/L (ref 3.5–5.1)
Sodium: 138 mEq/L (ref 135–145)
Total Bilirubin: 0.4 mg/dL (ref 0.2–1.2)
Total Protein: 7.4 g/dL (ref 6.0–8.3)

## 2022-09-04 LAB — POCT GLYCOSYLATED HEMOGLOBIN (HGB A1C): Hemoglobin A1C: 6.6 % — AB (ref 4.0–5.6)

## 2022-09-04 LAB — MICROALBUMIN / CREATININE URINE RATIO
Creatinine,U: 118.1 mg/dL
Microalb Creat Ratio: 11.5 mg/g (ref 0.0–30.0)
Microalb, Ur: 13.6 mg/dL — ABNORMAL HIGH (ref 0.0–1.9)

## 2022-09-04 MED ORDER — BLOOD GLUCOSE METER KIT
PACK | 1 refills | Status: DC
Start: 2022-09-04 — End: 2022-12-05

## 2022-09-04 MED ORDER — TRIAMTERENE-HCTZ 37.5-25 MG PO TABS
1.0000 | ORAL_TABLET | Freq: Every day | ORAL | 3 refills | Status: DC
Start: 2022-09-04 — End: 2023-09-24

## 2022-09-04 MED ORDER — ONETOUCH ULTRA VI STRP
ORAL_STRIP | 3 refills | Status: DC
Start: 2022-09-04 — End: 2022-12-05

## 2022-09-04 MED ORDER — AMLODIPINE BESYLATE 5 MG PO TABS: 5.0000 mg | ORAL_TABLET | Freq: Every day | ORAL | 1 refills | Status: DC

## 2022-09-04 MED ORDER — LOSARTAN POTASSIUM 25 MG PO TABS: 25.0000 mg | ORAL_TABLET | Freq: Every day | ORAL | 1 refills | Status: DC

## 2022-09-04 MED ORDER — METOPROLOL SUCCINATE ER 50 MG PO TB24
50.0000 mg | ORAL_TABLET | Freq: Every day | ORAL | 3 refills | Status: DC
Start: 2022-09-04 — End: 2022-12-05

## 2022-09-04 MED ORDER — ATORVASTATIN CALCIUM 10 MG PO TABS
10.0000 mg | ORAL_TABLET | Freq: Every day | ORAL | 3 refills | Status: DC
Start: 2022-09-04 — End: 2022-12-05

## 2022-09-04 MED ORDER — ONETOUCH DELICA PLUS LANCET30G MISC
3 refills | Status: DC
Start: 2022-09-04 — End: 2022-12-05

## 2022-09-04 MED ORDER — EMPAGLIFLOZIN 25 MG PO TABS
25.0000 mg | ORAL_TABLET | Freq: Every day | ORAL | 3 refills | Status: DC
Start: 2022-09-04 — End: 2022-12-05

## 2022-09-04 NOTE — Assessment & Plan Note (Addendum)
Chronic, stable.  Continue amlodipine 5 mg, metoprolol succinate 50mg  QD, triamterene hydrochlorothiazide 37.5-25 mg, losartan 25 mg daily for renal protection/microalbuminuria.  Will consider decreasing amlodipine altogether and increasing losartan for further renal protection.

## 2022-09-04 NOTE — Assessment & Plan Note (Signed)
Lab Results  Component Value Date   HGBA1C 6.6 (A) 09/04/2022   Chronic, stable .  Continue Jardiance 25mg  qd.  Pending repeat urine microalbumin.

## 2022-09-04 NOTE — Progress Notes (Signed)
Assessment & Plan:  Elevated glucose -     POCT glycosylated hemoglobin (Hb A1C)  Hyperlipidemia, unspecified hyperlipidemia type -     Atorvastatin Calcium; Take 1 tablet (10 mg total) by mouth daily.  Dispense: 90 tablet; Refill: 3  Diabetes mellitus without complication [facility]) Assessment & Plan: Lab Results  Component Value Date   HGBA1C 6.6 (A) 09/04/2022   Chronic, stable .  Continue Jardiance 25mg  qd.  Pending repeat urine microalbumin.   Orders: -     blood glucose meter kit and supplies; Dispense based on patient and insurance preference. Check fasting am glucose as well as before meals for total four times daily.  (FOR ICD-10 E10.9, E11.9).Hypertension associated with diabetes (HCC)  Dispense: 1 each; Refill: 1 -     Empagliflozin; Take 1 tablet (25 mg total) by mouth daily before breakfast.  Dispense: 90 tablet; Refill: 3 -     OneTouch Delica Plus Lancet30G; Check blood sugar am fasting and TID before meals.  Dispense: 100 each; Refill: 3 -     OneTouch Ultra; Check blood sugar fasting am and TID before meals.  Dispense: 100 each; Refill: 3 -     Comprehensive metabolic panel -     Microalbumin / creatinine urine ratio  Hypertension associated with diabetes (HCC) Assessment & Plan: Chronic, stable.  Continue amlodipine 5 mg, metoprolol succinate 50mg  QD, triamterene hydrochlorothiazide 37.5-25 mg, losartan 25 mg daily for renal protection/microalbuminuria.  Will consider decreasing amlodipine altogether and increasing losartan for further renal protection.  Orders: -     Metoprolol Succinate ER; Take 1 tablet (50 mg total) by mouth daily. Take with or immediately following a meal.  Dispense: 90 tablet; Refill: 3 -     Triamterene-HCTZ; Take 1 tablet by mouth daily.  Dispense: 90 tablet; Refill: 3  Other orders -     amLODIPine Besylate; Take 1 tablet (5 mg total) by mouth daily.  Dispense: 90 tablet; Refill: 1 -     Losartan Potassium; Take 1 tablet (25 mg total) by  mouth daily.  Dispense: 90 tablet; Refill: 1     Return precautions given.   Risks, benefits, and alternatives of the medications and treatment plan prescribed today were discussed, and patient expressed understanding.   Education regarding symptom management and diagnosis given to patient on AVS either electronically or printed.  Return in about 3 months (around 12/05/2022).  Rennie Plowman, FNP  Subjective:    Patient ID: Kura Wainio, female    DOB: [birth year], 62 y.o.   MRN: [id number]  CC: Alleta Barilla is a 62 y.o. female who presents today for follow up.   HPI: Feels well today.  No new complaints. Endorses on occasion dietary indiscretion with sweet tea or soda.    Allergies: Ace inhibitors and Codeine Current Outpatient Medications on File Prior to Visit  Medication Sig Dispense Refill   misoprostol (CYTOTEC) 200 MCG tablet Insert four tablets vaginally the night prior to your appointment 4 tablet 0   norethindrone (AYGESTIN) 5 MG tablet TAKE 1 TABLET (5 MG TOTAL) BY MOUTH DAILY. 30 tablet 2   No current facility-administered medications on file prior to visit.    Review of Systems  Constitutional:  Negative for chills and fever.  Respiratory:  Negative for cough.   Cardiovascular:  Negative for chest pain and palpitations.  Gastrointestinal:  Negative for nausea and vomiting.      Objective:    BP 130/70   Pulse 70   Temp 98.2 F (  36.8 C) (Oral)   Ht 4\' 11"  (1.499 m)   Wt 252 lb 9.6 oz (114.6 kg)   SpO2 97%   BMI 51.02 kg/m  BP Readings from Last 3 Encounters:  09/04/22 130/70  06/05/22 128/78  02/24/22 136/86   Wt Readings from Last 3 Encounters:  09/04/22 252 lb 9.6 oz (114.6 kg)  06/05/22 258 lb 3.2 oz (117.1 kg)  02/24/22 261 lb (118.4 kg)    Physical Exam Vitals reviewed.  Constitutional:      Appearance: She is well-developed.  Eyes:     Conjunctiva/sclera: Conjunctivae normal.  Cardiovascular:     Rate and Rhythm: Normal rate  and regular rhythm.     Pulses: Normal pulses.     Heart sounds: Normal heart sounds.  Pulmonary:     Effort: Pulmonary effort is normal.     Breath sounds: Normal breath sounds. No wheezing, rhonchi or rales.  Skin:    General: Skin is warm and dry.  Neurological:     Mental Status: She is alert.  Psychiatric:        Speech: Speech normal.        Behavior: Behavior normal.        Thought Content: Thought content normal.

## 2022-09-04 NOTE — Patient Instructions (Addendum)
Please call  and schedule your 3D mammogram and /or bone density scan as we discussed.   [facility]  ( new location in 2023)  [address], [place], [place] [address]  [place], [place]  [phone]   Carbohydrate Counting for Diabetes Mellitus, Adult Carbohydrate counting is a method of keeping track of how many carbohydrates you eat. Eating carbohydrates increases the amount of sugar (glucose) in the blood. Counting how many carbohydrates you eat improves how well you manage your blood glucose. This, in turn, helps you manage your diabetes. Carbohydrates are measured in grams (g) per serving. It is important to know how many carbohydrates (in grams or by serving size) you can have in each meal. This is different for every person. A dietitian can help you make a meal plan and calculate how many carbohydrates you should have at each meal and snack. What foods contain carbohydrates? Carbohydrates are found in the following foods: Grains, such as breads and cereals. Dried beans and soy products. Starchy vegetables, such as potatoes, peas, and corn. Fruit and fruit juices. Milk and yogurt. Sweets and snack foods, such as cake, cookies, candy, chips, and soft drinks. How do I count carbohydrates in foods? There are two ways to count carbohydrates in food. You can read food labels or learn standard serving sizes of foods. You can use either of these methods or a combination of both. Using the Nutrition Facts label The Nutrition Facts list is included on the labels of almost all packaged foods and beverages in the [place]. It includes: The serving size. Information about nutrients in each serving, including the grams of carbohydrate per serving. To use the Nutrition Facts, decide how many servings you will have. Then, multiply the number of servings by the number of carbohydrates per serving. The resulting number is the total grams of carbohydrates that you will be  having. Learning the standard serving sizes of foods When you eat carbohydrate foods that are not packaged or do not include Nutrition Facts on the label, you need to measure the servings in order to count the grams of carbohydrates. Measure the foods that you will eat with a food scale or measuring cup, if needed. Decide how many standard-size servings you will eat. Multiply the number of servings by 15. For foods that contain carbohydrates, one serving equals 15 g of carbohydrates. For example, if you eat 2 cups or 10 oz (300 g) of strawberries, you will have eaten 2 servings and 30 g of carbohydrates (2 servings x 15 g = 30 g). For foods that have more than one food mixed, such as soups and casseroles, you must count the carbohydrates in each food that is included. The following list contains standard serving sizes of common carbohydrate-rich foods. Each of these servings has about 15 g of carbohydrates: 1 slice of bread. 1 six-inch (15 cm) tortilla. ? cup or 2 oz (53 g) cooked rice or pasta.  cup or 3 oz (85 g) cooked or canned, drained and rinsed beans or lentils.  cup or 3 oz (85 g) starchy vegetable, such as peas, corn, or squash.  cup or 4 oz (120 g) hot cereal.  cup or 3 oz (85 g) boiled or mashed potatoes, or  or 3 oz (85 g) of a large baked potato.  cup or 4 fl oz (118 mL) fruit juice. 1 cup or 8 fl oz (237 mL) milk. 1 small or 4 oz (106 g) apple.  or 2  oz (63 g) of a medium banana. 1 cup or 5 oz (150 g) strawberries. 3 cups or 1 oz (28.3 g) popped popcorn. What is an example of carbohydrate counting? To calculate the grams of carbohydrates in this sample meal, follow the steps shown below. Sample meal 3 oz (85 g) chicken breast. ? cup or 4 oz (106 g) brown rice.  cup or 3 oz (85 g) corn. 1 cup or 8 fl oz (237 mL) milk. 1 cup or 5 oz (150 g) strawberries with sugar-free whipped topping. Carbohydrate calculation Identify the foods that contain  carbohydrates: Rice. Corn. Milk. Strawberries. Calculate how many servings you have of each food: 2 servings rice. 1 serving corn. 1 serving milk. 1 serving strawberries. Multiply each number of servings by 15 g: 2 servings rice x 15 g = 30 g. 1 serving corn x 15 g = 15 g. 1 serving milk x 15 g = 15 g. 1 serving strawberries x 15 g = 15 g. Add together all of the amounts to find the total grams of carbohydrates eaten: 30 g + 15 g + 15 g + 15 g = 75 g of carbohydrates total. What are tips for following this plan? Shopping Develop a meal plan and then make a shopping list. Buy fresh and frozen vegetables, fresh and frozen fruit, dairy, eggs, beans, lentils, and whole grains. Look at food labels. Choose foods that have more fiber and less sugar. Avoid processed foods and foods with added sugars. Meal planning Aim to have the same number of grams of carbohydrates at each meal and for each snack time. Plan to have regular, balanced meals and snacks. Where to find more information American Diabetes Association: diabetes.org Centers for Disease Control and Prevention: TonerPromos.no Academy of Nutrition and Dietetics: eatright.org Association of Diabetes Care & Education Specialists: diabeteseducator.org Summary Carbohydrate counting is a method of keeping track of how many carbohydrates you eat. Eating carbohydrates increases the amount of sugar (glucose) in your blood. Counting how many carbohydrates you eat improves how well you manage your blood glucose. This helps you manage your diabetes. A dietitian can help you make a meal plan and calculate how many carbohydrates you should have at each meal and snack. This information is not intended to replace advice given to you by your health care provider. Make sure you discuss any questions you have with your health care provider. Document Revised: 08/27/2019 Document Reviewed: 08/27/2019 Elsevier Patient Education  2024 [employer].

## 2022-09-08 ENCOUNTER — Other Ambulatory Visit: Payer: Self-pay

## 2022-09-08 DIAGNOSIS — E1159 Type 2 diabetes mellitus with other circulatory complications: Secondary | ICD-10-CM

## 2022-09-08 MED ORDER — LOSARTAN POTASSIUM 50 MG PO TABS: 50.0000 mg | ORAL_TABLET | Freq: Every day | ORAL | 3 refills | Status: DC

## 2022-09-08 NOTE — Addendum Note (Signed)
Addended by: Donavan Foil on: 09/08/2022 10:20 AM   Modules accepted: Orders

## 2022-09-11 ENCOUNTER — Telehealth: Payer: Self-pay | Admitting: Family

## 2022-09-11 NOTE — Telephone Encounter (Signed)
Alvino Blood See result note I dont see where BMP lab is sch 1 week after losartan increase Please sch

## 2022-09-11 NOTE — Telephone Encounter (Signed)
LVM to call back to schedule appt for labs after beginning new medication

## 2022-09-11 NOTE — Telephone Encounter (Signed)
Pt is scheduled for labs 8/12

## 2022-09-12 NOTE — Telephone Encounter (Signed)
Noted  

## 2022-09-18 ENCOUNTER — Other Ambulatory Visit (INDEPENDENT_AMBULATORY_CARE_PROVIDER_SITE_OTHER): Payer: 59

## 2022-09-18 DIAGNOSIS — E1159 Type 2 diabetes mellitus with other circulatory complications: Secondary | ICD-10-CM

## 2022-09-18 DIAGNOSIS — Z7984 Long term (current) use of oral hypoglycemic drugs: Secondary | ICD-10-CM | POA: Diagnosis not present

## 2022-09-18 DIAGNOSIS — I152 Hypertension secondary to endocrine disorders: Secondary | ICD-10-CM | POA: Diagnosis not present

## 2022-09-20 ENCOUNTER — Encounter: Payer: Self-pay | Admitting: Family

## 2022-12-04 ENCOUNTER — Ambulatory Visit: Payer: 59 | Admitting: Family

## 2022-12-05 ENCOUNTER — Encounter: Payer: Self-pay | Admitting: Family

## 2022-12-05 ENCOUNTER — Ambulatory Visit: Payer: 59 | Admitting: Family

## 2022-12-05 VITALS — BP 130/80 | HR 90 | Temp 98.8°F | Ht 59.0 in | Wt 261.2 lb

## 2022-12-05 DIAGNOSIS — Z23 Encounter for immunization: Secondary | ICD-10-CM | POA: Diagnosis not present

## 2022-12-05 DIAGNOSIS — E119 Type 2 diabetes mellitus without complications: Secondary | ICD-10-CM | POA: Diagnosis not present

## 2022-12-05 DIAGNOSIS — I152 Hypertension secondary to endocrine disorders: Secondary | ICD-10-CM

## 2022-12-05 DIAGNOSIS — D229 Melanocytic nevi, unspecified: Secondary | ICD-10-CM | POA: Diagnosis not present

## 2022-12-05 DIAGNOSIS — E1159 Type 2 diabetes mellitus with other circulatory complications: Secondary | ICD-10-CM

## 2022-12-05 DIAGNOSIS — Z7984 Long term (current) use of oral hypoglycemic drugs: Secondary | ICD-10-CM

## 2022-12-05 DIAGNOSIS — E785 Hyperlipidemia, unspecified: Secondary | ICD-10-CM

## 2022-12-05 DIAGNOSIS — R7309 Other abnormal glucose: Secondary | ICD-10-CM

## 2022-12-05 LAB — POCT GLYCOSYLATED HEMOGLOBIN (HGB A1C): Hemoglobin A1C: 6.9 % — AB (ref 4.0–5.6)

## 2022-12-05 MED ORDER — EMPAGLIFLOZIN 25 MG PO TABS
25.0000 mg | ORAL_TABLET | Freq: Every day | ORAL | 3 refills | Status: DC
Start: 2022-12-05 — End: 2023-12-24

## 2022-12-05 MED ORDER — LOSARTAN POTASSIUM 50 MG PO TABS: 50.0000 mg | ORAL_TABLET | Freq: Every day | ORAL | 3 refills | Status: DC

## 2022-12-05 MED ORDER — METOPROLOL SUCCINATE ER 50 MG PO TB24
50.0000 mg | ORAL_TABLET | Freq: Every day | ORAL | 3 refills | Status: DC
Start: 2022-12-05 — End: 2023-12-24

## 2022-12-05 MED ORDER — ONETOUCH ULTRA VI STRP
ORAL_STRIP | 3 refills | Status: DC
Start: 2022-12-05 — End: 2023-12-26

## 2022-12-05 MED ORDER — BLOOD GLUCOSE METER KIT
PACK | 1 refills | Status: DC
Start: 2022-12-05 — End: 2022-12-05

## 2022-12-05 MED ORDER — ONETOUCH DELICA PLUS LANCET30G MISC
3 refills | Status: AC
Start: 2022-12-05 — End: ?

## 2022-12-05 MED ORDER — ATORVASTATIN CALCIUM 10 MG PO TABS
10.0000 mg | ORAL_TABLET | Freq: Every day | ORAL | 3 refills | Status: DC
Start: 2022-12-05 — End: 2023-12-09

## 2022-12-05 NOTE — Patient Instructions (Addendum)
Please call  and schedule your 3D mammogram and /or bone density scan as we discussed.   [facility]  ( new location in 2023)  [address], [place], [place] [address]  [place], [place]  [phone]   Referral for eye exam  Let us know if you dont hear back within a week in regards to an appointment being scheduled.   So that you are aware, if you are [facility] MyChart user , please pay attention to your MyChart messages as you may receive a MyChart message with a phone number to call and schedule this test/appointment own your own from our referral coordinator. This is a new process so I do not want you to miss this message.  If you are not a MyChart user, you will receive a phone call.

## 2022-12-05 NOTE — Progress Notes (Signed)
Assessment & Plan:  Elevated glucose -     POCT glycosylated hemoglobin (Hb A1C) -     Microalbumin / creatinine urine ratio  Diabetes mellitus without complication (HCC) Assessment & Plan: Lab Results  Component Value Date   HGBA1C 6.9 (A) 12/05/2022   Chronic, stable however increased.  Continue Jardiance 25mg  qd.  Pending repeat urine microalbumin.  Patient prefers to make dietary changes prior to escalating regimen.  We did discuss Ozempic.  She will consider this in the future  Orders: -     OneTouch Ultra; Check blood sugar fasting am and TID before meals.  Dispense: 100 each; Refill: 3 -     OneTouch Delica Plus Lancet30G; Check blood sugar am fasting and TID before meals.  Dispense: 100 each; Refill: 3 -     Empagliflozin; Take 1 tablet (25 mg total) by mouth daily before breakfast.  Dispense: 90 tablet; Refill: 3 -     Ambulatory referral to Ophthalmology  Hypertension associated with diabetes [facility]) Assessment & Plan: Chronic, stable.  Continue losartan 50mg  every day, metoprolol succinate 50mg  QD, triamterene hydrochlorothiazide 37.5-25 mg.   Orders: -     Metoprolol Succinate ER; Take 1 tablet (50 mg total) by mouth daily. Take with or immediately following a meal.  Dispense: 90 tablet; Refill: 3 -     Comprehensive metabolic panel  Hyperlipidemia, unspecified hyperlipidemia type -     Atorvastatin Calcium; Take 1 tablet (10 mg total) by mouth daily.  Dispense: 90 tablet; Refill: 3  Multiple nevi -     Ambulatory referral to Dermatology  Other orders -     Losartan Potassium; Take 1 tablet (50 mg total) by mouth daily.  Dispense: 90 tablet; Refill: 3     Return precautions given.   Risks, benefits, and alternatives of the medications and treatment plan prescribed today were discussed, and patient expressed understanding.   Education regarding symptom management and diagnosis given to patient on AVS either electronically or printed.  Return in about 3 months  (around 03/07/2023).  Rennie Plowman, FNP  Subjective:    Patient ID: Lynn Hughes, female    DOB: 1960/12/11, 62 y.o.   MRN: [id number]  CC: Lynn Hughes is a 62 y.o. female who presents today for follow up.   HPI: Feels well today. No new complaints. she is no longer on amlodipine.  Compliant with losartan 50 mg daily.   She notes dietary indiscretion over the last couple of months.   She has moles on her face that she would like removed. Nontender.   Allergies: Ace inhibitors and Codeine Current Outpatient Medications on File Prior to Visit  Medication Sig Dispense Refill   triamterene-hydrochlorothiazide (MAXZIDE-25) 37.5-25 MG tablet Take 1 tablet by mouth daily. 90 tablet 3   No current facility-administered medications on file prior to visit.    Review of Systems  Constitutional:  Negative for chills and fever.  Respiratory:  Negative for cough.   Cardiovascular:  Negative for chest pain and palpitations.  Gastrointestinal:  Negative for nausea and vomiting.      Objective:    BP 130/80   Pulse 90   Temp 98.8 F (37.1 C) (Oral)   Ht 4\' 11"  (1.499 m)   Wt 261 lb 3.2 oz (118.5 kg)   SpO2 96%   BMI 52.76 kg/m  BP Readings from Last 3 Encounters:  12/05/22 130/80  09/04/22 130/70  06/05/22 128/78   Wt Readings from Last 3 Encounters:  12/05/22 261 lb  3.2 oz (118.5 kg)  09/04/22 252 lb 9.6 oz (114.6 kg)  06/05/22 258 lb 3.2 oz (117.1 kg)    Physical Exam Vitals reviewed.  Constitutional:      Appearance: She is well-developed.  Eyes:     Conjunctiva/sclera: Conjunctivae normal.  Cardiovascular:     Rate and Rhythm: Normal rate and regular rhythm.     Pulses: Normal pulses.     Heart sounds: Normal heart sounds.  Pulmonary:     Effort: Pulmonary effort is normal.     Breath sounds: Normal breath sounds. No wheezing, rhonchi or rales.  Skin:    General: Skin is warm and dry.  Neurological:     Mental Status: She is alert.  Psychiatric:         Speech: Speech normal.        Behavior: Behavior normal.        Thought Content: Thought content normal.

## 2022-12-05 NOTE — Assessment & Plan Note (Signed)
Lab Results  Component Value Date   HGBA1C 6.9 (A) 12/05/2022   Chronic, stable however increased.  Continue Jardiance 25mg  qd.  Pending repeat urine microalbumin.  Patient prefers to make dietary changes prior to escalating regimen.  We did discuss Ozempic.  She will consider this in the future

## 2022-12-05 NOTE — Assessment & Plan Note (Signed)
Chronic, stable.  Continue losartan 50mg  every day, metoprolol succinate 50mg  QD, triamterene hydrochlorothiazide 37.5-25 mg.

## 2022-12-05 NOTE — Addendum Note (Signed)
Addended by: [place], Nelida Mandarino on: 12/05/2022 10:55 AM   Modules accepted: Orders

## 2023-03-13 ENCOUNTER — Telehealth: Payer: Self-pay

## 2023-03-13 ENCOUNTER — Other Ambulatory Visit (HOSPITAL_COMMUNITY): Payer: Self-pay

## 2023-03-13 NOTE — Telephone Encounter (Signed)
 Pharmacy Patient Advocate Encounter  Received notification from [facility] that Prior Authorization for Jardiance  25 mg tablets has been APPROVED from 03/12/23 to 03/11/24. Ran test claim, Copay is $29.99. This test claim was processed through [facility]- copay amounts may vary at other pharmacies due to pharmacy/plan contracts, or as the patient moves through the different stages of their insurance plan.   PA #/Case ID/Reference #: [id number]

## 2023-04-26 ENCOUNTER — Ambulatory Visit: Admitting: Family

## 2023-04-26 ENCOUNTER — Encounter: Payer: Self-pay | Admitting: Family

## 2023-04-26 VITALS — BP 132/82 | HR 87 | Temp 98.4°F | Ht 59.0 in | Wt 261.4 lb

## 2023-04-26 DIAGNOSIS — R7309 Other abnormal glucose: Secondary | ICD-10-CM

## 2023-04-26 DIAGNOSIS — Z7984 Long term (current) use of oral hypoglycemic drugs: Secondary | ICD-10-CM

## 2023-04-26 DIAGNOSIS — Z1231 Encounter for screening mammogram for malignant neoplasm of breast: Secondary | ICD-10-CM | POA: Diagnosis not present

## 2023-04-26 DIAGNOSIS — I152 Hypertension secondary to endocrine disorders: Secondary | ICD-10-CM | POA: Diagnosis not present

## 2023-04-26 DIAGNOSIS — E1159 Type 2 diabetes mellitus with other circulatory complications: Secondary | ICD-10-CM

## 2023-04-26 DIAGNOSIS — E119 Type 2 diabetes mellitus without complications: Secondary | ICD-10-CM | POA: Diagnosis not present

## 2023-04-26 LAB — COMPREHENSIVE METABOLIC PANEL
ALT: 10 U/L (ref 0–35)
AST: 14 U/L (ref 0–37)
Albumin: 4.3 g/dL (ref 3.5–5.2)
Alkaline Phosphatase: 69 U/L (ref 39–117)
BUN: 16 mg/dL (ref 6–23)
CO2: 32 meq/L (ref 19–32)
Calcium: 9.9 mg/dL (ref 8.4–10.5)
Chloride: 99 meq/L (ref 96–112)
Creatinine, Ser: 0.89 mg/dL (ref 0.40–1.20)
GFR: 68.46 mL/min (ref >60.00)
Glucose, Bld: 134 mg/dL — ABNORMAL HIGH (ref 70–99)
Potassium: 3.6 meq/L (ref 3.5–5.1)
Sodium: 138 meq/L (ref 135–145)
Total Bilirubin: 0.4 mg/dL (ref 0.2–1.2)
Total Protein: 7.8 g/dL (ref 6.0–8.3)

## 2023-04-26 LAB — MICROALBUMIN / CREATININE URINE RATIO
Creatinine,U: 65.9 mg/dL
Microalb Creat Ratio: 36.1 mg/g — ABNORMAL HIGH (ref 0.0–30.0)
Microalb, Ur: 2.4 mg/dL — ABNORMAL HIGH (ref 0.0–1.9)

## 2023-04-26 LAB — POCT GLYCOSYLATED HEMOGLOBIN (HGB A1C): Hemoglobin A1C: 6.8 % — AB (ref 4.0–5.6)

## 2023-04-26 NOTE — Assessment & Plan Note (Addendum)
 Discussed blood pressure slightly elevated.  We did discuss increasing losartan to 100 mg.  She politely declines at this time.  Will await urine micro ,and she will bring blood pressure machine to follow-up Continue losartan 50 mg , Maxide 25 mg daily, Toprol 25 mg daily.

## 2023-04-26 NOTE — Assessment & Plan Note (Signed)
 Chronic, stable.  Continue Jardiance 25 mg

## 2023-04-26 NOTE — Progress Notes (Signed)
 Assessment & Plan:  Diabetes mellitus without complication (HCC) Assessment & Plan: Chronic, stable.  Continue Jardiance 25 mg  Orders: -     Comprehensive metabolic panel -     Microalbumin / creatinine urine ratio  Elevated glucose -     POCT glycosylated hemoglobin (Hb A1C)  Encounter for screening mammogram for malignant neoplasm of breast -     3D Screening Mammogram, Left and Right; Future  Hypertension associated with diabetes (HCC) Assessment & Plan: Discussed blood pressure slightly elevated.  We did discuss increasing losartan to 100 mg.  She politely declines at this time.  Will await urine micro ,and she will bring blood pressure machine to follow-up Continue losartan 50 mg , Maxide 25 mg daily, Toprol 25 mg daily.      Return precautions given.   Risks, benefits, and alternatives of the medications and treatment plan prescribed today were discussed, and patient expressed understanding.   Education regarding symptom management and diagnosis given to patient on AVS either electronically or printed.  Return in about 3 months (around 07/27/2023).  Rennie Plowman, FNP  Subjective:    Patient ID: Lynn Hughes, female    DOB: 1961/01/12, 63 y.o.   MRN: [id number]  CC: Lynn Hughes is a 63 y.o. female who presents today for follow up.   HPI: Feels well today  No new complaints   Endorses dietary indiscretion with sweets including sweet tea.  She has been compliant with Jardiance 25 mg daily   Denies chest pain, shortness of breath.  No formal exercise Allergies: Ace inhibitors and Codeine Current Outpatient Medications on File Prior to Visit  Medication Sig Dispense Refill   atorvastatin (LIPITOR) 10 MG tablet Take 1 tablet (10 mg total) by mouth daily. 90 tablet 3   empagliflozin (JARDIANCE) 25 MG TABS tablet Take 1 tablet (25 mg total) by mouth daily before breakfast. 90 tablet 3   Lancets (ONETOUCH DELICA PLUS LANCET30G) MISC Check blood sugar am  fasting and TID before meals. 100 each 3   losartan (COZAAR) 50 MG tablet Take 1 tablet (50 mg total) by mouth daily. 90 tablet 3   metoprolol succinate (TOPROL-XL) 50 MG 24 hr tablet Take 1 tablet (50 mg total) by mouth daily. Take with or immediately following a meal. 90 tablet 3   ONETOUCH ULTRA test strip Check blood sugar fasting am and TID before meals. 100 each 3   triamterene-hydrochlorothiazide (MAXZIDE-25) 37.5-25 MG tablet Take 1 tablet by mouth daily. 90 tablet 3   No current facility-administered medications on file prior to visit.    Review of Systems  Constitutional:  Negative for chills and fever.  Respiratory:  Negative for cough.   Cardiovascular:  Negative for chest pain and palpitations.  Gastrointestinal:  Negative for nausea and vomiting.      Objective:    BP 132/82   Pulse 87   Temp 98.4 F (36.9 C) (Oral)   Ht 4\' 11"  (1.499 m)   Wt 261 lb 6.4 oz (118.6 kg)   SpO2 96%   BMI 52.80 kg/m  BP Readings from Last 3 Encounters:  04/26/23 132/82  12/05/22 130/80  09/04/22 130/70   Wt Readings from Last 3 Encounters:  04/26/23 261 lb 6.4 oz (118.6 kg)  12/05/22 261 lb 3.2 oz (118.5 kg)  09/04/22 252 lb 9.6 oz (114.6 kg)    Physical Exam Vitals reviewed.  Constitutional:      Appearance: She is well-developed.  Eyes:     Conjunctiva/sclera: Conjunctivae  normal.  Cardiovascular:     Rate and Rhythm: Normal rate and regular rhythm.     Pulses: Normal pulses.     Heart sounds: Normal heart sounds.  Pulmonary:     Effort: Pulmonary effort is normal.     Breath sounds: Normal breath sounds. No wheezing, rhonchi or rales.  Skin:    General: Skin is warm and dry.  Neurological:     Mental Status: She is alert.  Psychiatric:        Speech: Speech normal.        Behavior: Behavior normal.        Thought Content: Thought content normal.

## 2023-04-26 NOTE — Patient Instructions (Addendum)
 Please call  and schedule your 3D mammogram and /or bone density scan as we discussed.   [facility]  ( new location in 2023)  [address], [place], [place] [address]  [place], [place]  [phone]   Nice to see you today!

## 2023-05-11 ENCOUNTER — Telehealth: Payer: Self-pay

## 2023-05-11 NOTE — Telephone Encounter (Signed)
 Copied from CRM #[id number]. Topic: Clinical - Lab/Test Results >> May 11, 2023 11:17 AM [place] E wrote: Reason for CRM: Patient called back regarding her lab results. Agent relayed the message in her chart left by a CMA and patient prefers to recheck labs in 3 months.

## 2023-05-11 NOTE — Telephone Encounter (Signed)
Noted. THANKS.

## 2023-05-15 NOTE — Telephone Encounter (Signed)
 Noted F/u sch 07/27/23

## 2023-07-27 ENCOUNTER — Ambulatory Visit: Admitting: Family

## 2023-08-14 ENCOUNTER — Ambulatory Visit
Admission: RE | Admit: 2023-08-14 | Discharge: 2023-08-14 | Disposition: A | Discharge status: Home / Self Care | Source: Ambulatory Visit | Attending: Family | Admitting: Family

## 2023-08-14 DIAGNOSIS — Z1231 Encounter for screening mammogram for malignant neoplasm of breast: Secondary | ICD-10-CM | POA: Diagnosis present

## 2023-09-22 ENCOUNTER — Other Ambulatory Visit: Payer: Self-pay | Admitting: Family

## 2023-09-22 DIAGNOSIS — E1159 Type 2 diabetes mellitus with other circulatory complications: Secondary | ICD-10-CM

## 2023-10-19 ENCOUNTER — Ambulatory Visit (INDEPENDENT_AMBULATORY_CARE_PROVIDER_SITE_OTHER)

## 2023-10-19 ENCOUNTER — Encounter: Payer: Self-pay | Admitting: Family

## 2023-10-19 ENCOUNTER — Ambulatory Visit: Admitting: Family

## 2023-10-19 ENCOUNTER — Telehealth: Payer: Self-pay | Admitting: Family

## 2023-10-19 VITALS — BP 162/70 | HR 92 | Temp 99.1°F | Resp 20 | Ht 59.0 in | Wt 265.1 lb

## 2023-10-19 DIAGNOSIS — J40 Bronchitis, not specified as acute or chronic: Secondary | ICD-10-CM | POA: Diagnosis not present

## 2023-10-19 DIAGNOSIS — Z7984 Long term (current) use of oral hypoglycemic drugs: Secondary | ICD-10-CM

## 2023-10-19 DIAGNOSIS — E119 Type 2 diabetes mellitus without complications: Secondary | ICD-10-CM

## 2023-10-19 DIAGNOSIS — E1159 Type 2 diabetes mellitus with other circulatory complications: Secondary | ICD-10-CM | POA: Diagnosis not present

## 2023-10-19 DIAGNOSIS — I152 Hypertension secondary to endocrine disorders: Secondary | ICD-10-CM | POA: Diagnosis not present

## 2023-10-19 DIAGNOSIS — R051 Acute cough: Secondary | ICD-10-CM | POA: Diagnosis not present

## 2023-10-19 LAB — COMPREHENSIVE METABOLIC PANEL WITH GFR
ALT: 10 U/L (ref 0–35)
AST: 11 U/L (ref 0–37)
Albumin: 4.2 g/dL (ref 3.5–5.2)
Alkaline Phosphatase: 74 U/L (ref 39–117)
BUN: 19 mg/dL (ref 6–23)
CO2: 33 meq/L — ABNORMAL HIGH (ref 19–32)
Calcium: 10 mg/dL (ref 8.4–10.5)
Chloride: 101 meq/L (ref 96–112)
Creatinine, Ser: 0.92 mg/dL (ref 0.40–1.20)
GFR: 65.57 mL/min (ref >60.00)
Glucose, Bld: 131 mg/dL — ABNORMAL HIGH (ref 70–99)
Potassium: 4.3 meq/L (ref 3.5–5.1)
Sodium: 143 meq/L (ref 135–145)
Total Bilirubin: 0.4 mg/dL (ref 0.2–1.2)
Total Protein: 7.5 g/dL (ref 6.0–8.3)

## 2023-10-19 LAB — CBC WITH DIFFERENTIAL/PLATELET
Basophils Absolute: 0.1 K/uL (ref 0.0–0.1)
Basophils Relative: 0.7 % (ref 0.0–3.0)
Eosinophils Absolute: 0.2 K/uL (ref 0.0–0.7)
Eosinophils Relative: 2.3 % (ref 0.0–5.0)
HCT: 46.3 % — ABNORMAL HIGH (ref 36.0–46.0)
Hemoglobin: 14.5 g/dL (ref 12.0–15.0)
Lymphocytes Relative: 11.7 % — ABNORMAL LOW (ref 12.0–46.0)
Lymphs Abs: 1 K/uL (ref 0.7–4.0)
MCHC: 31.2 g/dL (ref 30.0–36.0)
MCV: 83.8 fl (ref 78.0–100.0)
Monocytes Absolute: 0.5 K/uL (ref 0.1–1.0)
Monocytes Relative: 5.7 % (ref 3.0–12.0)
Neutro Abs: 6.8 K/uL (ref 1.4–7.7)
Neutrophils Relative %: 79.6 % — ABNORMAL HIGH (ref 43.0–77.0)
Platelets: 323 K/uL (ref 150.0–400.0)
RBC: 5.53 Mil/uL — ABNORMAL HIGH (ref 3.87–5.11)
RDW: 17.9 % — ABNORMAL HIGH (ref 11.5–15.5)
WBC: 8.6 K/uL (ref 4.0–10.5)

## 2023-10-19 LAB — POC COVID19 BINAXNOW: SARS Coronavirus 2 Ag: NEGATIVE

## 2023-10-19 LAB — LIPID PANEL
Cholesterol: 131 mg/dL (ref 0–200)
HDL: 51.4 mg/dL (ref >39.00)
LDL Cholesterol: 62 mg/dL (ref 0–99)
NonHDL: 79.59
Total CHOL/HDL Ratio: 3
Triglycerides: 86 mg/dL (ref 0.0–149.0)
VLDL: 17.2 mg/dL (ref 0.0–40.0)

## 2023-10-19 LAB — MICROALBUMIN / CREATININE URINE RATIO
Creatinine,U: 85.2 mg/dL
Microalb Creat Ratio: 47.1 mg/g — ABNORMAL HIGH (ref 0.0–30.0)
Microalb, Ur: 4 mg/dL — ABNORMAL HIGH (ref 0.0–1.9)

## 2023-10-19 LAB — POCT RAPID STREP A (OFFICE): Rapid Strep A Screen: NEGATIVE

## 2023-10-19 LAB — POCT GLYCOSYLATED HEMOGLOBIN (HGB A1C): Hemoglobin A1C: 7.4 % — AB (ref 4.0–5.6)

## 2023-10-19 LAB — POC INFLUENZA A&B (BINAX/QUICKVUE)
Influenza A, POC: NEGATIVE
Influenza B, POC: NEGATIVE

## 2023-10-19 LAB — TSH: TSH: 2.05 u[IU]/mL (ref 0.35–5.50)

## 2023-10-19 MED ORDER — AZITHROMYCIN 250 MG PO TABS: ORAL_TABLET | ORAL | 0 refills | Status: AC

## 2023-10-19 MED ORDER — ALBUTEROL SULFATE HFA 108 (90 BASE) MCG/ACT IN AERS: 2.0000 | INHALATION_SPRAY | Freq: Four times a day (QID) | RESPIRATORY_TRACT | 0 refills | Status: DC | PRN

## 2023-10-19 NOTE — Progress Notes (Unsigned)
 Assessment & Plan:  Bronchitis Assessment & Plan: No acute respiratory distress.  She is afebrile.  Negative flu, COVID and strep. When patient was being roomed, SaO2 dropped to 88% for couple of seconds.  I redemonstrated this in the room.  I used 2 different pulse oximeter's on either hand.  Noted temporary drop to 88% or 90% which lasted for couple seconds.  She is well and wearing gel nail polish bilaterally.  Sa02 would quickly rebound and remain 95 to 99%.  When walking down the hall, patient's SaO2 remained 95 to 99%.  She was not tachypneic.  She was not labored in her speech.  No adventitious lung sounds.  She denies wheezing.  Remote history of smoking.  Chest x-ray reassuring.    Patient and I had a thoughtful and long discussion in regards to unexpected SaO2 temporary drop.  I advised for her to purchase pulse oximeter at home.  Counseled patient on how to read and use SaO2 . Advised if sa02 were to drop below 90% or if she were to feel short of breath she would need to call 911 or present the emergency room.  She verbalized understanding of all.   Start azithromycin  for concern for bacterial URI due to duration of symptoms.  Advised over-the-counter Mucinex DM.Close follow-up.  Orders: -     Azithromycin ; Take 2 tablets on day 1, then 1 tablet daily on days 2 through 5  Dispense: 6 tablet; Refill: 0 -     Albuterol  Sulfate HFA; Inhale 2 puffs into the lungs every 6 (six) hours as needed for wheezing or shortness of breath.  Dispense: 8 g; Refill: 0 -     DG Chest 2 View; Future  Acute cough -     POC COVID-19 BinaxNow -     POC Influenza A&B(BINAX/QUICKVUE) -     POCT rapid strep A  Hypertension associated with diabetes (HCC) Assessment & Plan: Elevated .  Suspect cough is affecting blood pressure.    we did discuss increasing losartan  to 100 mg.  She politely declines at this time.  Will await urine microalbumin ,and she will bring blood pressure machine to  follow-up Continue losartan  50 mg , Maxide 25 mg daily, Toprol  25 mg daily.  Discussed risk of  ASCVD due to hypertension, diabetes, hyperlipidemia, sedentary lifestyle.  In the absence of symptoms, strongly encouraged consideration for CT calcium  score.  Information provided on after visit summary.  She will let me know.  Orders: -     POCT glycosylated hemoglobin (Hb A1C) -     CBC with Differential/Platelet -     TSH -     Lipid panel -     Microalbumin / creatinine urine ratio -     Comprehensive metabolic panel with GFR  Diabetes mellitus without complication (HCC) Assessment & Plan: Chronic, suboptimal control.  Discussed goal of A1c 6.5.  She politely declines escalation of diabetic medications.  She endorses dietary indiscretion and would like to work on diet. Continue Jardiance  25 mg daily for now. Consider GLP-1.  Orders: -     Lipid panel -     Microalbumin / creatinine urine ratio     Return precautions given.   Risks, benefits, and alternatives of the medications and treatment plan prescribed today were discussed, and patient expressed understanding.   Education regarding symptom management and diagnosis given to patient on AVS either electronically or printed.  Return in about 1 week (around 10/26/2023).  Rollene Northern,  FNP  Subjective:    Patient ID: Lynn Hughes, female    DOB: Apr 29, 1960, 63 y.o.   MRN: [id number]  CC: Lynn Hughes is a 63 y.o. female who presents today for follow up.   HPI: Complains of cough and congestion x 7 days, worsening. She does feel better today than I had '2 days ago.' Worse symptom is cough. Sore throat has improved after 'hot toddy' She hasn't taken cough medication.  Denies leg swelling, fever, chills, ear pain, teeth sensitivity, facial pain, CP, sob, wheezing  No formal exercise.   She endorses dietary indiscretion of late.     No heart attack or CVA in father, mother or siblings.    Compliant with  Jardiance  25 mg daily.    Compliant losartan  50 mg daily, Toprol  50 mg daily, triamterene -hydrochlorothiazide 37.5-25 mg daily.    Former smoker, quit 1988. Allergies: Ace inhibitors, Lisinopril, Codeine, and Oxycodone -acetaminophen  Current Outpatient Medications on File Prior to Visit  Medication Sig Dispense Refill   atorvastatin  (LIPITOR) 10 MG tablet Take 1 tablet (10 mg total) by mouth daily. 90 tablet 3   empagliflozin  (JARDIANCE ) 25 MG TABS tablet Take 1 tablet (25 mg total) by mouth daily before breakfast. 90 tablet 3   Lancets (ONETOUCH DELICA PLUS LANCET30G) MISC Check blood sugar am fasting and TID before meals. 100 each 3   losartan  (COZAAR ) 50 MG tablet Take 1 tablet (50 mg total) by mouth daily. 90 tablet 3   metoprolol  succinate (TOPROL -XL) 50 MG 24 hr tablet Take 1 tablet (50 mg total) by mouth daily. Take with or immediately following a meal. 90 tablet 3   ONETOUCH ULTRA test strip Check blood sugar fasting am and TID before meals. 100 each 3   triamterene -hydrochlorothiazide (MAXZIDE-25) 37.5-25 MG tablet TAKE 1 TABLET BY MOUTH EVERY DAY 90 tablet 3   No current facility-administered medications on file prior to visit.    Review of Systems  Constitutional:  Negative for chills and fever.  HENT:  Positive for congestion and sore throat. Negative for sinus pressure and sinus pain.   Respiratory:  Positive for cough. Negative for shortness of breath and wheezing.   Cardiovascular:  Negative for chest pain, palpitations and leg swelling.  Gastrointestinal:  Negative for nausea and vomiting.      Objective:    BP (!) 162/70   Pulse 92   Temp 99.1 F (37.3 C)   Resp 20   Ht 4' 11 (1.499 m)   Wt 265 lb 2 oz (120.3 kg)   SpO2 93%   BMI 53.55 kg/m  BP Readings from Last 3 Encounters:  10/19/23 (!) 162/70  04/26/23 132/82  12/05/22 130/80   Wt Readings from Last 3 Encounters:  10/19/23 265 lb 2 oz (120.3 kg)  04/26/23 261 lb 6.4 oz (118.6 kg)  12/05/22 261 lb  3.2 oz (118.5 kg)    Physical Exam Vitals reviewed.  Constitutional:      Appearance: She is well-developed.  HENT:     Head: Normocephalic and atraumatic.     Right Ear: Hearing, tympanic membrane, ear canal and external ear normal. No decreased hearing noted. No drainage, swelling or tenderness. No middle ear effusion. No foreign body. Tympanic membrane is not erythematous or bulging.     Left Ear: Hearing, tympanic membrane, ear canal and external ear normal. No decreased hearing noted. No drainage, swelling or tenderness.  No middle ear effusion. No foreign body. Tympanic membrane is not erythematous or bulging.  Nose: Nose normal. No rhinorrhea.     Right Sinus: No maxillary sinus tenderness or frontal sinus tenderness.     Left Sinus: No maxillary sinus tenderness or frontal sinus tenderness.     Mouth/Throat:     Pharynx: Uvula midline. No oropharyngeal exudate or posterior oropharyngeal erythema.     Tonsils: No tonsillar abscesses.  Eyes:     Conjunctiva/sclera: Conjunctivae normal.  Cardiovascular:     Rate and Rhythm: Normal rate and regular rhythm.     Pulses: Normal pulses.     Heart sounds: Normal heart sounds.     Comments: No LE edema, palpable cords or masses. No erythema or increased warmth. No asymmetry in calf size when compared bilaterally No discoloration .  Pulmonary:     Effort: Pulmonary effort is normal.     Breath sounds: Normal breath sounds. No wheezing, rhonchi or rales.  Musculoskeletal:     Right lower leg: No edema.     Left lower leg: No edema.  Lymphadenopathy:     Head:     Right side of head: No submental, submandibular, tonsillar, preauricular, posterior auricular or occipital adenopathy.     Left side of head: No submental, submandibular, tonsillar, preauricular, posterior auricular or occipital adenopathy.     Cervical: No cervical adenopathy.  Skin:    General: Skin is warm and dry.  Neurological:     Mental Status: She is alert.   Psychiatric:        Speech: Speech normal.        Behavior: Behavior normal.        Thought Content: Thought content normal.

## 2023-10-19 NOTE — Assessment & Plan Note (Signed)
 Elevated .  We did discuss increasing losartan  to 100 mg.  She politely declines at this time.  Will await urine micro ,and she will bring blood pressure machine to follow-up Continue losartan  50 mg , Maxide 25 mg daily, Toprol  25 mg daily.

## 2023-10-19 NOTE — Telephone Encounter (Signed)
 Rollene wanted to see patient back in 1 week. Rollene does not have any appointments until Oct. Unable to schedule patient's 1w follow up.

## 2023-10-19 NOTE — Patient Instructions (Addendum)
 As discussed today, I am very suspicious that your pulse ox dropped to 88% temporarily due to nail polish.  You do not appear to be in respiratory distress and it immediately bounced into 95 or 100% and remained there while we were walking.  I would however encourage you to have a pulse oximeter at home.  Normal is above 90%.  Anything less than 90% you would need to call 911 or report to nearest emergency room.  I have started an antibiotic azithromycin .  Please purchase over-the-counter Mucinex dm.    We are obtaining a chest x-ray and labs today.  If any concerns for symptoms getting worse over the weekend or new symptoms developing.  Please go to the emergency room to be reevaluated.   If you would like to further stratify your cardiovascular risk or consider medication management, I would first recommend obtaining a CT calcium  score.  Information included below.  Please let me know if you would like to order and I will place.    Once I have ordered, you may schedule on your own by calling [phone] ( [facility] [address] in [place] ).   An estimate of cost is $150-200 out-of-pocket as not covered by insurance.    Below an article from [facility] regarding the test.   https://[email or web address].org/imaging/exams-and-procedures/screenings/cardiac-ct#:~:text=A%20cardiac%20CT%20calcium%20score,arteries%20can%20cause%20heart%20attacks.   Exams We Offer: Cardiac CT Calcium  Score  Knowing your score could save your life. A cardiac CT calcium  score, also known as a coronary calcium  scan, is a quick, convenient and noninvasive way of evaluating the amount of calcified (hard) plaque in your heart vessels. The level of calcium  equates to the extent of plaque build-up in your arteries. Plaque in the arteries can cause heart attacks.  The radiologist reads the images and sends your doctor a report with a calcium  score. Patients with higher scores have a greater risk for a heart  attack, heart disease or stroke. Knowing your score can help your doctor decide on blood pressure and cholesterol goals that will minimize your risk as much as possible.  The [employer] of Cardiology found that Coronary artery calcification (CAC) is an excellent cardiovascular disease risk marker and can help guide the decision to use cholesterol reducing medications such as statins. A negative calcium  score may reduce the need for statins in otherwise eligible patients.  The exam takes less than 10 minutes, is painless and does not require any IV or oral contrast.  Who should get a Cardiac CT Calcium  Score: Middle age adults at intermediate risk of heart disease Family history of heart disease Borderline high cholesterol, high blood pressure or diabetes Overweight or physical inactivity Uncertain about taking daily preventive medical therapy       Diabetes: Carbohydrate Counting for Adults Carbohydrate counting is a method of keeping track of how many carbohydrates you eat. Eating carbohydrates increases the amount of sugar, also called glucose, in your blood. By counting how many carbohydrates you eat, you can improve how well you manage your blood sugar. This, in turn, helps you manage your diabetes. Carbohydrates are measured in grams (g) per serving. It's important to know how many carbohydrates (in grams or by serving size) you can have in each meal. This is different for every person. A dietitian can help you make a meal plan and calculate how many carbohydrates you should have at each meal and snack. What foods contain carbohydrates? Carbohydrates are found in these foods: Grains, such as breads and cereals. Dried beans and soy  products. Starchy vegetables, such as potatoes, peas, and corn. Fruit and fruit juices. Milk and yogurt. Sweets and snack foods like cake, cookies, candy, chips, and soft drinks. How do I count carbohydrates in foods? There are two ways to count  carbohydrates in food. You can read food labels or learn standard serving sizes of foods. You can use either of these methods or a combination of both. Using the Nutrition Facts label The Nutrition Facts list is included on the labels of almost all packaged foods and drinks in the [place] It includes: The serving size. Information about nutrients in each serving. This includes the grams of carbohydrate per serving. To use the Nutrition Facts, decide how many servings you will have. Then, multiply the number of servings by the number of carbohydrates per serving. The resulting number is the total grams of carbohydrates that you'll be having. Learning the standard serving sizes of foods When you eat carbohydrate foods that aren't packaged or don't include Nutrition Facts on the label, you need to measure the servings in order to count the grams of carbohydrates. Measure the foods that you'll eat with a food scale or measuring cup, if needed. Decide how many standard-size servings you'll eat. Multiply the number of servings by 15. For foods that contain carbohydrates, one serving equals 15 g of carbohydrates. For example, if you eat 2 cups or 10 oz (300 g) of strawberries, you'll have eaten 2 servings and 30 g of carbohydrates (2 servings x 15 g = 30 g). For foods that have more than one food mixed, such as soups and casseroles, you must count the carbohydrates in each food that's included. Here's a list of standard serving sizes for common carbohydrate-rich foods. Each of these servings has about 15 g of carbohydrates: 1 slice of bread. 1 six-inch (15 cm) tortilla. ? cup or 2 oz (53 g) of cooked rice or pasta.  cup or 3 oz (85 g) of cooked or canned, drained, and rinsed beans or lentils.  cup or 3 oz (85 g) of a starchy vegetable, such as peas, corn, or squash.  cup or 4 oz (120 g) of hot cereal.  cup or 3 oz (85 g) of boiled or mashed potatoes, or  or 3 oz (85 g) of a large baked potato.  cup  or 4 fl oz (118 mL) of fruit juice. 1 cup or 8 fl oz (237 mL) of milk. 1 small or 4 oz (106 g) apple.  or 2 oz (63 g) of a medium banana. 1 cup or 5 oz (150 g) of strawberries. 3 cups or 1 oz (28.3 g) of popped popcorn. What is an example of carbohydrate counting? To calculate the grams of carbohydrates in this sample meal, follow the steps below. Sample meal 3 oz (85 g) chicken breast. ? cup or 4 oz (106 g) of brown rice.  cup or 3 oz (85 g) of corn. 1 cup or 8 fl oz (237 mL) of milk. 1 cup or 5 oz (150 g) of strawberries with sugar-free whipped topping. Carbohydrate calculation Identify the foods that have carbohydrates: Rice. Corn. Milk. Strawberries. Calculate how many servings you have of each food: 2 servings of rice. 1 serving of corn. 1 serving of milk. 1 serving of strawberries. Multiply each number of servings by 15 g: 2 servings of rice x 15 g = 30 g. 1 serving of corn x 15 g = 15 g. 1 serving of milk x 15 g = 15 g. 1  serving of strawberries x 15 g = 15 g. Add together all of the amounts to find the total grams of carbohydrates eaten: 30 g + 15 g + 15 g + 15 g = 75 g of carbohydrates total. Where to find more information To learn more, go to: American Diabetes Association at diabetes.org. Click Search and type carb counting. Find the link you need. Centers for Disease Control and Prevention at [employer].no. Click Search and type diabetes. Find the link you need. Academy of Nutrition and Dietetics: eatright.org This information is not intended to replace advice given to you by your health care provider. Make sure you discuss any questions you have with your health care provider. Document Revised: 01/10/2023 Document Reviewed: 01/10/2023 Elsevier Patient Education  2025 [employer].

## 2023-10-20 ENCOUNTER — Ambulatory Visit: Payer: Self-pay | Admitting: Family

## 2023-10-20 DIAGNOSIS — R899 Unspecified abnormal finding in specimens from other organs, systems and tissues: Secondary | ICD-10-CM

## 2023-10-20 NOTE — Assessment & Plan Note (Addendum)
 No acute respiratory distress.  She is afebrile.  Negative flu, COVID and strep. When patient was being roomed, SaO2 dropped to 88% for couple of seconds.  I redemonstrated this in the room.  I used 2 different pulse oximeter's on either hand.  Noted temporary drop to 88% or 90% which lasted for couple seconds.  She is well and wearing gel nail polish bilaterally.  Sa02 would quickly rebound and remain 95 to 99%.  When walking down the hall, patient's SaO2 remained 95 to 99%.  She was not tachypneic.  She was not labored in her speech.  No adventitious lung sounds.  She denies wheezing.  Remote history of smoking.  Chest x-ray reassuring.    Patient and I had a thoughtful and long discussion in regards to unexpected SaO2 temporary drop.  I advised for her to purchase pulse oximeter at home.  Counseled patient on how to read and use SaO2 . Advised if sa02 were to drop below 90% or if she were to feel short of breath she would need to call 911 or present the emergency room.  She verbalized understanding of all.   Start azithromycin  for concern for bacterial URI due to duration of symptoms.  Advised over-the-counter Mucinex DM.Close follow-up.

## 2023-10-20 NOTE — Assessment & Plan Note (Addendum)
 Chronic, suboptimal control.  Discussed goal of A1c 6.5.  She politely declines escalation of diabetic medications.  She endorses dietary indiscretion and would like to work on diet. Continue Jardiance  25 mg daily for now. Consider GLP-1.

## 2023-10-22 NOTE — Telephone Encounter (Signed)
 Spoke to pt she stated that the zpak has helped a lot and she is feeling much better, will call to schedule f/up if she feels like she  needs to

## 2023-12-03 ENCOUNTER — Ambulatory Visit (INDEPENDENT_AMBULATORY_CARE_PROVIDER_SITE_OTHER): Payer: 59 | Admitting: Dermatology

## 2023-12-03 DIAGNOSIS — D1722 Benign lipomatous neoplasm of skin and subcutaneous tissue of left arm: Secondary | ICD-10-CM | POA: Diagnosis not present

## 2023-12-03 DIAGNOSIS — L821 Other seborrheic keratosis: Secondary | ICD-10-CM

## 2023-12-03 NOTE — Patient Instructions (Addendum)
 Discussed cosmetic procedure ED, noncovered.  $60 for 1st lesion and $15 for each additional lesion if done on the same day.  Maximum charge $350.  One touch-up treatment included no charge. Discussed risks of treatment including dyspigmentation, small scar, and/or recurrence. Recommend daily broad spectrum sunscreen SPF 30+/photoprotection to treated areas once healed.  Recommend starting moisturizer with exfoliant (Urea, Salicylic acid, or Lactic acid) one to two times daily to help smooth rough and bumpy skin.  OTC options include Cetaphil Rough and Bumpy lotion (Urea), Eucerin Roughness Relief lotion or spot treatment cream (Urea), CeraVe SA lotion/cream for Rough and Bumpy skin (Sal Acid), Gold Bond Rough and Bumpy cream (Sal Acid), and AmLactin 12% lotion/cream (Lactic Acid).  If applying in morning, also apply sunscreen to sun-exposed areas, since these exfoliating moisturizers can increase sensitivity to sun.   Due to recent changes in healthcare laws, you may see results of your pathology and/or laboratory studies on MyChart before the doctors have had a chance to review them. We understand that in some cases there may be results that are confusing or concerning to you. Please understand that not all results are received at the same time and often the doctors may need to interpret multiple results in order to provide you with the best plan of care or course of treatment. Therefore, we ask that you please give us  2 business days to thoroughly review all your results before contacting the office for clarification. Should we see a critical lab result, you will be contacted sooner.   If You Need Anything After Your Visit  If you have any questions or concerns for your doctor, please call our main line at [phone] and press option 4 to reach your doctor's medical assistant. If no one answers, please leave a voicemail as directed and we will return your call as soon as possible. Messages left after  4 pm will be answered the following business day.   You may also send us  a message via MyChart. We typically respond to MyChart messages within 1-2 business days.  For prescription refills, please ask your pharmacy to contact our office. Our fax number is [phone].  If you have an urgent issue when the clinic is closed that cannot wait until the next business day, you can page your doctor at the number below.    Please note that while we do our best to be available for urgent issues outside of office hours, we are not available 24/7.   If you have an urgent issue and are unable to reach us , you may choose to seek medical care at your doctor's office, retail clinic, urgent care center, or emergency room.  If you have a medical emergency, please immediately call 911 or go to the emergency department.  Pager Numbers  - Dr. Hester: [phone]  - Dr. Jackquline: [phone]  - Dr. Claudene: [phone]   - Dr. Raymund: [phone]  In the event of inclement weather, please call our main line at [phone] for an update on the status of any delays or closures.  Dermatology Medication Tips: Please keep the boxes that topical medications come in in order to help keep track of the instructions about where and how to use these. Pharmacies typically print the medication instructions only on the boxes and not directly on the medication tubes.   If your medication is too expensive, please contact our office at [phone] option 4 or send us  a message through MyChart.   We are unable to tell  what your co-pay for medications will be in advance as this is different depending on your insurance coverage. However, we may be able to find a substitute medication at lower cost or fill out paperwork to get insurance to cover a needed medication.   If a prior authorization is required to get your medication covered by your insurance company, please allow us  1-2 business days to complete this  process.  Drug prices often vary depending on where the prescription is filled and some pharmacies may offer cheaper prices.  The website www.goodrx.com contains coupons for medications through different pharmacies. The prices here do not account for what the cost may be with help from insurance (it may be cheaper with your insurance), but the website can give you the price if you did not use any insurance.  - You can print the associated coupon and take it with your prescription to the pharmacy.  - You may also stop by our office during regular business hours and pick up a GoodRx coupon card.  - If you need your prescription sent electronically to a different pharmacy, notify our office through [facility] or by phone at [phone] option 4.     Si Usted Necesita Algo Despus de Su Visita  Tambin puede enviarnos un mensaje a travs de Clinical Cytogeneticist. Por lo general respondemos a los mensajes de MyChart en el transcurso de 1 a 2 das hbiles.  Para renovar recetas, por favor pida a su farmacia que se ponga en contacto con nuestra oficina. Randi lakes de fax es [place] [phone].  Si tiene un asunto urgente cuando la clnica est cerrada y que no puede esperar hasta el siguiente da hbil, puede llamar/localizar a su doctor(a) al nmero que aparece a continuacin.   Por favor, tenga en cuenta que aunque hacemos todo lo posible para estar disponibles para asuntos urgentes fuera del horario de [place], no estamos disponibles las 24 horas del da, los 7 [address]  [address] de la [place].   Si tiene un problema urgente y no puede comunicarse con nosotros, puede optar por buscar atencin mdica  en el consultorio de su doctor(a), en una clnica privada, en un centro de atencin urgente o en una sala de emergencias.  Si tiene engineer, drilling, por favor llame inmediatamente al 911 o vaya a la sala de emergencias.  Nmeros de bper  - Dr. Hester: [phone]  - Dra. Jackquline: [phone]  - Dr.  Claudene: [phone]  - Dra. Kitts: [phone]  En caso de inclemencias del [place], por favor llame a nuestra lnea principal al [phone] para una actualizacin sobre el estado de cualquier retraso o cierre.  Consejos para la medicacin en dermatologa: Por favor, guarde las cajas en las que vienen los medicamentos de uso tpico para ayudarle a seguir las instrucciones sobre dnde y cmo usarlos. Las farmacias generalmente imprimen las instrucciones del medicamento slo en las cajas y no directamente en los tubos del [place].   Si su medicamento es muy caro, por favor, pngase en contacto con landry rieger llamando al [phone] y presione la opcin 4 o envenos un mensaje a travs de Clinical Cytogeneticist.   No podemos decirle cul ser su copago por los medicamentos por adelantado ya que esto es diferente dependiendo de la cobertura de su seguro. Sin embargo, es posible que podamos encontrar un medicamento sustituto a audiological scientist un formulario para que el seguro cubra el medicamento que se considera necesario.   Si se requiere una autorizacin previa para que su  compaa de seguros cubra su medicamento, por favor permtanos de 1 a 2 das hbiles para completar este proceso.  Los precios de los medicamentos varan con frecuencia dependiendo del environmental consultant de dnde se surte la receta y alguna farmacias pueden ofrecer precios ms baratos.  El sitio web www.goodrx.com tiene cupones para medicamentos de health and safety inspector. Los precios aqu no tienen en cuenta lo que podra costar con la ayuda del seguro (puede ser ms barato con su seguro), pero el sitio web puede darle el precio si no utiliz tourist information centre manager.  - Puede imprimir el cupn correspondiente y llevarlo con su receta a la farmacia.  - Tambin puede pasar por nuestra oficina durante el horario de atencin regular y education officer, museum una tarjeta de cupones de GoodRx.  - Si necesita que su receta se enve electrnicamente a una farmacia diferente,  informe a nuestra oficina a travs de MyChart de [place] o por telfono llamando al [phone] y presione la opcin 4.

## 2023-12-03 NOTE — Progress Notes (Signed)
 New Patient Visit   Subjective  Lynn Hughes is a 63 y.o. female who presents for the following: Dark spots on the face x years. She would like removed. No symptoms She also has a mass on the left upper arm, present over 20 years, slowly enlarging. Strangers comment on growth to patient- she would like it treated. Hard for clothes to fit over it. Similar smaller growth on the lower back.   Referral from Rollene Northern, FNP.  The following portions of the chart were reviewed this encounter and updated as appropriate: medications, allergies, medical history  Review of Systems:  No other skin or systemic complaints except as noted in HPI or Assessment and Plan.  Objective  Well appearing patient in no apparent distress; mood and affect are within normal limits.  A focused examination was performed of the following areas: Face, arms  Relevant physical exam findings are noted in the Assessment and Plan.  face Numerous small stuck-on, waxy brown papules on the face.  Photos today       Assessment & Plan  Lipoma  Exam: Large subcutaneous rubbery mass at the left upper arm. Growth on the left upper arm slowly enlarging over time. Smaller similar rubbery nodule at R lower back   Benign-appearing. Exam most consistent with an Lipoma. Discussed that a Lipoma is a benign fatty growth that can grow over time and sometimes become painful or otherwise symptomatic. Some patients may have one or several lipomas.. Benign Hereditary Lipomatosis is a hereditary familial condition where family members tend to grow multiple lipomas.  Recommend observation if it is not changing, growing or symptomatic. Recommend surgical excision to remove it if it is painful, growing, symptomatic, or other changes noted. Patient would like lipoma on arm removed due to enlarging, strangers make comments to patient due to large size. Hard for clothes to fit over it. [facility]erral sent to [facility].  May require liposuction due to size LIPOMA OF LEFT UPPER EXTREMITY   Related Procedures Ambulatory referral to Plastic Surgery SEBORRHEIC KERATOSIS face /DPN Reassured benign age-related growth. Discussed cosmetic ED. Patient would like to have treated. Discussed cosmetic procedure, noncovered.  $60 for 1st lesion and $15 for each additional lesion if done on the same day.  Maximum charge $350.  One touch-up treatment included no charge. Discussed risks of treatment including dyspigmentation, small scar, and/or recurrence. Recommend daily broad spectrum sunscreen SPF 30+/photoprotection to treated areas once healed.  ~50 lesions treated on each side L and R face.  100 total lesions treated with ED today for $350 noncovered charge. Vaseline to aas until healed.  Recommend starting moisturizer with exfoliant (Urea, Salicylic acid, or Lactic acid) one to two times daily to help smooth rough and bumpy skin.  OTC options include Cetaphil Rough and Bumpy lotion (Urea), Eucerin Roughness Relief lotion or spot treatment cream (Urea), CeraVe SA lotion/cream for Rough and Bumpy skin (Sal Acid), Gold Bond Rough and Bumpy cream (Sal Acid), and AmLactin 12% lotion/cream (Lactic Acid).  If applying in morning, also apply sunscreen to sun-exposed areas, since these exfoliating moisturizers can increase sensitivity to sun.   Destruction of lesion - face  Destruction method: electrodesiccation and curettage   Destruction method comment:  Electrodesiccation only, not curretage Informed consent: discussed and consent obtained   Patient was prepped and draped in usual sterile fashion: patient was prepped with isopropyl alcohol. Outcome: patient tolerated procedure well with no complications   Post-procedure details: wound care instructions  given     Return in about 2 months (around 02/02/2024) for f/up SKs/DPN.  IAndrea Kerns, CMA, am acting as scribe for Rexene Rattler, MD  .   Documentation: I have reviewed the above documentation for accuracy and completeness, and I agree with the above.  Rexene Rattler, MD

## 2023-12-06 ENCOUNTER — Other Ambulatory Visit: Payer: Self-pay

## 2023-12-06 DIAGNOSIS — E785 Hyperlipidemia, unspecified: Secondary | ICD-10-CM

## 2023-12-06 MED ORDER — LOSARTAN POTASSIUM 25 MG PO TABS: 25.0000 mg | ORAL_TABLET | Freq: Every day | ORAL | 3 refills | Status: DC

## 2023-12-06 MED ORDER — ATORVASTATIN CALCIUM 20 MG PO TABS: 20.0000 mg | ORAL_TABLET | Freq: Every day | ORAL | 3 refills | Status: AC

## 2023-12-07 ENCOUNTER — Telehealth: Payer: Self-pay | Admitting: Family

## 2023-12-07 NOTE — Telephone Encounter (Signed)
 There are labs order in notes that are not in future lab order if needed please add to future

## 2023-12-09 ENCOUNTER — Other Ambulatory Visit: Payer: Self-pay | Admitting: Family

## 2023-12-10 ENCOUNTER — Other Ambulatory Visit: Payer: Self-pay

## 2023-12-10 ENCOUNTER — Telehealth: Payer: Self-pay

## 2023-12-10 DIAGNOSIS — R899 Unspecified abnormal finding in specimens from other organs, systems and tissues: Secondary | ICD-10-CM

## 2023-12-10 NOTE — Telephone Encounter (Signed)
 Copied from CRM [id number]. Topic: Clinical - Lab/Test Results >> Dec 10, 2023  9:12 AM Lynn Hughes wrote: Reason for CRM: pt is calling in regards to most recent lab results please follow up with pt to assist.

## 2023-12-10 NOTE — Telephone Encounter (Signed)
 Spoke to pt she had message from my chart stating that she had new results, I explained there were no new results she has lab appt on 12/13/23 orders are in as well

## 2023-12-11 NOTE — Addendum Note (Signed)
 Addended by: MARYLEN PRO A on: 12/11/2023 02:19 PM   Modules accepted: Orders

## 2023-12-13 ENCOUNTER — Other Ambulatory Visit

## 2023-12-17 ENCOUNTER — Ambulatory Visit: Admitting: Plastic Surgery

## 2023-12-17 VITALS — Ht 59.0 in | Wt 265.0 lb

## 2023-12-17 DIAGNOSIS — R2232 Localized swelling, mass and lump, left upper limb: Secondary | ICD-10-CM | POA: Diagnosis not present

## 2023-12-17 NOTE — Progress Notes (Signed)
 Patient ID: Lynn Hughes, female    DOB: May 05, 1960, 63 y.o.   MRN: [id number]   Chief Complaint  Patient presents with   Advice Only   Skin Problem    The patient is a 63 year old female here for evaluation of her left arm.  The patient states she has had a mass on the anterior aspect of the upper arm for several years.  She thinks it has been there for as long as 10 years.  It has gotten quite a bit larger over the last 2 years and can sometimes be a little bit painful.  Today it is actually a little bit red.  It feels like a lipoma.  But due to the size and firmness I think we need to look into this further.  Her pulses are strong and equal.  The area involved is about 15 x 15 cm.    Review of Systems  Constitutional:  Positive for activity change.  HENT: Negative.    Eyes: Negative.   Respiratory: Negative.    Cardiovascular: Negative.   Gastrointestinal: Negative.   Endocrine: Negative.   Genitourinary: Negative.   Musculoskeletal: Negative.   Skin: Negative.     Past Medical History:  Diagnosis Date   Allergy    Anemia    Anxiety    Arthritis    Diabetes mellitus without complication (HCC)    Fibroids    Heart murmur    Hypertension     Past Surgical History:  Procedure Laterality Date   BOWEL RESECTION N/A 08/22/2018   Procedure: SMALL BOWEL RESECTION;  Surgeon: Jordis Laneta FALCON, MD;  Location: [facility] ORS;  Service: General;  Laterality: N/A;   BREAST EXCISIONAL BIOPSY Left 1995   surgical exc calcs neg    VENTRAL HERNIA REPAIR N/A 08/22/2018   Procedure: HERNIA REPAIR VENTRAL ADULT;  Surgeon: Jordis Laneta FALCON, MD;  Location: [facility] ORS;  Service: General;  Laterality: N/A;      Current Outpatient Medications:    albuterol  (VENTOLIN  HFA) 108 (90 Base) MCG/ACT inhaler, Inhale 2 puffs into the lungs every 6 (six) hours as needed for wheezing or shortness of breath., Disp: 8 g, Rfl: 0   atorvastatin  (LIPITOR) 20 MG tablet, Take 1 tablet (20 mg total) by mouth  daily., Disp: 90 tablet, Rfl: 3   empagliflozin  (JARDIANCE ) 25 MG TABS tablet, Take 1 tablet (25 mg total) by mouth daily before breakfast., Disp: 90 tablet, Rfl: 3   Lancets (ONETOUCH DELICA PLUS LANCET30G) MISC, Check blood sugar am fasting and TID before meals., Disp: 100 each, Rfl: 3   losartan  (COZAAR ) 25 MG tablet, Take 1 tablet (25 mg total) by mouth daily., Disp: 90 tablet, Rfl: 3   losartan  (COZAAR ) 50 MG tablet, Take 1 tablet (50 mg total) by mouth daily., Disp: 90 tablet, Rfl: 3   metoprolol  succinate (TOPROL -XL) 50 MG 24 hr tablet, Take 1 tablet (50 mg total) by mouth daily. Take with or immediately following a meal., Disp: 90 tablet, Rfl: 3   ONETOUCH ULTRA test strip, Check blood sugar fasting am and TID before meals., Disp: 100 each, Rfl: 3   triamterene -hydrochlorothiazide (MAXZIDE-25) 37.5-25 MG tablet, TAKE 1 TABLET BY MOUTH EVERY DAY, Disp: 90 tablet, Rfl: 3   Objective:   Vitals:    Physical Exam Vitals reviewed.  Constitutional:      Appearance: Normal appearance.  HENT:     Head: Atraumatic.  Cardiovascular:     Rate and Rhythm: Normal rate.  Pulses: Normal pulses.  Pulmonary:     Effort: Pulmonary effort is normal.  Abdominal:     Palpations: Abdomen is soft.  Musculoskeletal:        General: Tenderness present. No swelling.       Arms:  Skin:    General: Skin is warm.     Capillary Refill: Capillary refill takes less than 2 seconds.     Findings: Lesion present.  Neurological:     Mental Status: She is oriented to person, place, and time.  Psychiatric:        Mood and Affect: Mood normal.        Behavior: Behavior normal.        Thought Content: Thought content normal.        Judgment: Judgment normal.     Assessment & Plan:  Mass of arm, left  Plan for ultrasound left arm.  We will go ahead and plan for excision but will not do it until we actually have the results of the ultrasound.  Plan for excision left upper extremity mass.  Pictures  were obtained of the patient and placed in the chart with the patient's or guardian's permission.   Estefana RAMAN Kiara Mcdowell, DO

## 2023-12-18 ENCOUNTER — Other Ambulatory Visit: Payer: Self-pay | Admitting: Family

## 2023-12-18 ENCOUNTER — Other Ambulatory Visit

## 2023-12-18 ENCOUNTER — Other Ambulatory Visit (INDEPENDENT_AMBULATORY_CARE_PROVIDER_SITE_OTHER)

## 2023-12-18 DIAGNOSIS — R899 Unspecified abnormal finding in specimens from other organs, systems and tissues: Secondary | ICD-10-CM

## 2023-12-18 DIAGNOSIS — Z13220 Encounter for screening for lipoid disorders: Secondary | ICD-10-CM

## 2023-12-18 DIAGNOSIS — I152 Hypertension secondary to endocrine disorders: Secondary | ICD-10-CM

## 2023-12-18 NOTE — Addendum Note (Signed)
 Addended by: MARYLEN PRO A on: 12/18/2023 09:58 AM   Modules accepted: Orders

## 2023-12-19 LAB — IRON,TIBC AND FERRITIN PANEL
%SAT: 16 % (ref 16–45)
Ferritin: 9 ng/mL — ABNORMAL LOW (ref 16–288)
Iron: 64 ug/dL (ref 45–160)
TIBC: 399 ug/dL (ref 250–450)

## 2023-12-19 LAB — COMPREHENSIVE METABOLIC PANEL WITH GFR
ALT: 11 IU/L (ref 0–32)
AST: 8 IU/L (ref 0–40)
Albumin: 4.1 g/dL (ref 3.9–4.9)
Alkaline Phosphatase: 83 IU/L (ref 49–135)
BUN/Creatinine Ratio: 17 (ref 12–28)
BUN: 15 mg/dL (ref 8–27)
Bilirubin Total: 0.4 mg/dL (ref 0.0–1.2)
CO2: 27 mmol/L (ref 20–29)
Calcium: 10 mg/dL (ref 8.7–10.3)
Chloride: 98 mmol/L (ref 96–106)
Creatinine, Ser: 0.9 mg/dL (ref 0.57–1.00)
Globulin, Total: 3 g/dL (ref 1.5–4.5)
Glucose: 137 mg/dL — ABNORMAL HIGH (ref 70–99)
Potassium: 4.1 mmol/L (ref 3.5–5.2)
Sodium: 141 mmol/L (ref 134–144)
Total Protein: 7.1 g/dL (ref 6.0–8.5)
eGFR: 71 mL/min/1.73 (ref >59)

## 2023-12-19 LAB — CBC WITH DIFFERENTIAL/PLATELET
Basophils Absolute: 0.1 x10E3/uL (ref 0.0–0.2)
Basos: 1 %
EOS (ABSOLUTE): 0.2 x10E3/uL (ref 0.0–0.4)
Eos: 3 %
Hematocrit: 49.1 % — ABNORMAL HIGH (ref 34.0–46.6)
Hemoglobin: 14.8 g/dL (ref 11.1–15.9)
Immature Grans (Abs): 0 x10E3/uL (ref 0.0–0.1)
Immature Granulocytes: 0 %
Lymphocytes Absolute: 1.4 x10E3/uL (ref 0.7–3.1)
Lymphs: 18 %
MCH: 26.3 pg — ABNORMAL LOW (ref 26.6–33.0)
MCHC: 30.1 g/dL — ABNORMAL LOW (ref 31.5–35.7)
MCV: 87 fL (ref 79–97)
Monocytes Absolute: 0.4 x10E3/uL (ref 0.1–0.9)
Monocytes: 6 %
Neutrophils Absolute: 5.2 x10E3/uL (ref 1.4–7.0)
Neutrophils: 72 %
Platelets: 358 x10E3/uL (ref 150–450)
RBC: 5.62 x10E6/uL — ABNORMAL HIGH (ref 3.77–5.28)
RDW: 17 % — ABNORMAL HIGH (ref 11.7–15.4)
WBC: 7.3 x10E3/uL (ref 3.4–10.8)

## 2023-12-19 LAB — LIPID PANEL
Chol/HDL Ratio: 2.4 ratio (ref 0.0–4.4)
Cholesterol, Total: 121 mg/dL (ref 100–199)
HDL: 50 mg/dL (ref >39)
LDL Chol Calc (NIH): 54 mg/dL (ref 0–99)
Triglycerides: 90 mg/dL (ref 0–149)
VLDL Cholesterol Cal: 17 mg/dL (ref 5–40)

## 2023-12-19 LAB — TSH: TSH: 2.7 m[IU]/L (ref 0.40–4.50)

## 2023-12-19 LAB — JAK2 V617F, REFLEX TO E12-15

## 2023-12-19 LAB — MICROALBUMIN / CREATININE URINE RATIO
Creatinine, Urine: 64.4 mg/dL
Microalb/Creat Ratio: 103 mg/g{creat} — ABNORMAL HIGH (ref 0–29)
Microalbumin, Urine: 66.4 ug/mL

## 2023-12-20 LAB — CARBOXYHEMOGLOBIN: Carboxyhemoglobin: 4 %{Hb}

## 2023-12-20 LAB — ERYTHROPOIETIN: Erythropoietin: 30.7 m[IU]/mL — ABNORMAL HIGH (ref 2.6–18.5)

## 2023-12-22 ENCOUNTER — Other Ambulatory Visit: Payer: Self-pay | Admitting: Family

## 2023-12-22 DIAGNOSIS — E119 Type 2 diabetes mellitus without complications: Secondary | ICD-10-CM

## 2023-12-22 DIAGNOSIS — I152 Hypertension secondary to endocrine disorders: Secondary | ICD-10-CM

## 2023-12-24 ENCOUNTER — Encounter: Payer: Self-pay | Admitting: Family

## 2023-12-24 ENCOUNTER — Telehealth (INDEPENDENT_AMBULATORY_CARE_PROVIDER_SITE_OTHER): Admitting: Family

## 2023-12-24 VITALS — Wt 265.0 lb

## 2023-12-24 DIAGNOSIS — E119 Type 2 diabetes mellitus without complications: Secondary | ICD-10-CM | POA: Diagnosis not present

## 2023-12-24 DIAGNOSIS — Z1211 Encounter for screening for malignant neoplasm of colon: Secondary | ICD-10-CM

## 2023-12-24 DIAGNOSIS — R790 Abnormal level of blood mineral: Secondary | ICD-10-CM | POA: Diagnosis not present

## 2023-12-24 NOTE — Patient Instructions (Addendum)
 Schedule fasting labs 01/18/24 or a couple of days after   Cologuard ordered to screen for colon cancer.    Please call GYN to schedule follow for postmenopausal bleeding  Start ferrous sulfate 325 mg once daily with a little bit of orange juice.   We will repeat iron stores at follow up.

## 2023-12-24 NOTE — Progress Notes (Unsigned)
 Virtual Visit via Video Note  I connected with Shivonne A Apachito on 12/26/23 at  9:00 AM EST by a video enabled telemedicine application and verified that I am speaking with the correct person using two identifiers. Location patient: home Location provider: work  Persons participating in the virtual visit: patient, provider  I discussed the limitations of evaluation and management by telemedicine and the availability of in person appointments. The patient expressed understanding and agreed to proceed.  HPI:  Appointment today to review labs  She feels well today. No new complaints.  She hasn't taken blood pressure at home.  Denies dizziness, cp, sob  She is not taking iron supplements. She episodic vaginal bleeding which can heavy.   No blood in stool, hematuria.   Colonoscopy is due  No FDR colon cancer.    Compliant with Maxide 25 mg daily, Toprol  25 mg daily  Increased losartan  to 75mg  in the last week.   Increased atorvastatin  20mg  in the last week  Maxide 25 mg daily, Toprol  25 mg daily  Ferritin 9 RBC 5/62 RDW 17  LDL 54 Urine protein 103 ( prior 47)   Pending JAK  Consulted with [facility] 12/17/23 Planning US  LUE for suspected lipoma and then incision.   Last seen by Dr Connell 11/24/21   ROS: See pertinent positives and negatives per HPI.  EXAM:  VITALS per patient if applicable: Wt 265 lb (120.2 kg)   BMI 53.52 kg/m  BP Readings from Last 3 Encounters:  10/19/23 (!) 162/70  04/26/23 132/82  12/05/22 130/80   Wt Readings from Last 3 Encounters:  12/24/23 265 lb (120.2 kg)  12/17/23 265 lb (120.2 kg)  10/19/23 265 lb 2 oz (120.3 kg)    GENERAL: alert, oriented, appears well and in no acute distress  HEENT: atraumatic, conjunttiva clear, no obvious abnormalities on inspection of external nose and ears  NECK: normal movements of the head and neck  LUNGS: on inspection no signs of respiratory distress, breathing rate appears normal, no  obvious gross SOB, gasping or wheezing  CV: no obvious cyanosis  MS: moves all visible extremities without noticeable abnormality  PSYCH/NEURO: pleasant and cooperative, no obvious depression or anxiety, speech and thought processing grossly intact  ASSESSMENT AND PLAN: Diabetes mellitus without complication (HCC) -     Microalbumin / creatinine urine ratio; Future -     Hemoglobin A1c; Future  Low ferritin Assessment & Plan: Anticipate low ferritin related to postmenopausal bleeding.  Reiterated the importance of complete workup with GYN.  Reviewed Dr. Shaune last note in 2023.  Patient declines referral.  She states she will call to schedule GYN follow-up. Advised to start ferrous sulfate 325 mg 1 tablet daily.  Repeat CBC, iron stores in 4 weeks. consider hematology consult. Pending JAK2 lab.  Of note, she has trepidation in regards to anesthesia.  She prefers Cologuard and for colonoscopy.  I have ordered  Orders: -     Microalbumin / creatinine urine ratio; Future -     Comprehensive metabolic panel with GFR; Future -     Hemoglobin A1c; Future -     CBC with Differential/Platelet; Future -     Iron, TIBC and Ferritin Panel; Future -     B12 and Folate Panel; Future  Screen for colon cancer -     Cologuard     -we discussed possible serious and likely etiologies, options for evaluation and workup, limitations of telemedicine visit vs in person visit, treatment, treatment risks and  precautions. Pt prefers to treat via telemedicine empirically rather then risking or undertaking an in person visit at this moment.    I discussed the assessment and treatment plan with the patient. The patient was provided an opportunity to ask questions and all were answered. The patient agreed with the plan and demonstrated an understanding of the instructions.   The patient was advised to call back or seek an in-person evaluation if the symptoms worsen or if the condition fails to improve as  anticipated.  Advised if desired AVS can be mailed or viewed via MyChart if Mychart user.   Rollene Northern, FNP

## 2023-12-24 NOTE — Assessment & Plan Note (Signed)
 Anticipate low ferritin related to postmenopausal bleeding.  Reiterated the importance of complete workup with GYN.  Reviewed Dr. Shaune last note in 2023.  Patient declines referral.  She states she will call to schedule GYN follow-up. Advised to start ferrous sulfate 325 mg 1 tablet daily.  Repeat CBC, iron stores in 4 weeks. consider hematology consult. Pending JAK2 lab.  Of note, she has trepidation in regards to anesthesia.  She prefers Cologuard and for colonoscopy.  I have ordered

## 2023-12-26 ENCOUNTER — Telehealth: Payer: Self-pay

## 2023-12-26 ENCOUNTER — Other Ambulatory Visit: Payer: Self-pay

## 2023-12-26 ENCOUNTER — Ambulatory Visit
Admission: RE | Admit: 2023-12-26 | Discharge: 2023-12-26 | Disposition: A | Discharge status: Home / Self Care | Source: Ambulatory Visit | Attending: Plastic Surgery | Admitting: Plastic Surgery

## 2023-12-26 DIAGNOSIS — E119 Type 2 diabetes mellitus without complications: Secondary | ICD-10-CM

## 2023-12-26 DIAGNOSIS — R2232 Localized swelling, mass and lump, left upper limb: Secondary | ICD-10-CM | POA: Insufficient documentation

## 2023-12-26 MED ORDER — ONETOUCH ULTRA VI STRP: ORAL_STRIP | 3 refills | Status: DC

## 2023-12-26 NOTE — Telephone Encounter (Signed)
 Pt stated you was going to order iron supplements for her and she has been waiting but hasn't received anything yet

## 2023-12-27 ENCOUNTER — Ambulatory Visit: Payer: Self-pay | Admitting: Family

## 2023-12-27 LAB — JAK2 V617F, REFLEX TO E12-15

## 2023-12-27 LAB — E12-E15

## 2023-12-27 LAB — SPECIMEN STATUS REPORT

## 2023-12-27 NOTE — Telephone Encounter (Signed)
 Spoke to pt she verbalizes understanding will get otc

## 2023-12-27 NOTE — Telephone Encounter (Signed)
 LVM to call, back to discuss message below per Rollene

## 2023-12-27 NOTE — Telephone Encounter (Unsigned)
 Copied from CRM [id number]. Topic: General - Other >> Dec 27, 2023 11:20 AM Carlyon D wrote: Reason for CRM: Pt calling back due to VM from miss janeta. Called CAL pt was transferred to miss Jeneta >> Dec 27, 2023  3:02 PM Franky GRADE wrote: Patient has a few questions regarding the over the counter medications they discussed earlier.

## 2024-01-02 ENCOUNTER — Other Ambulatory Visit: Payer: Self-pay | Admitting: Family

## 2024-01-02 DIAGNOSIS — D751 Secondary polycythemia: Secondary | ICD-10-CM

## 2024-01-10 ENCOUNTER — Other Ambulatory Visit: Payer: Self-pay | Admitting: Plastic Surgery

## 2024-01-10 DIAGNOSIS — R2232 Localized swelling, mass and lump, left upper limb: Secondary | ICD-10-CM

## 2024-01-10 NOTE — Progress Notes (Signed)
Surgery request 

## 2024-01-15 ENCOUNTER — Inpatient Hospital Stay

## 2024-01-15 ENCOUNTER — Encounter: Payer: Self-pay | Admitting: Internal Medicine

## 2024-01-15 ENCOUNTER — Inpatient Hospital Stay: Attending: Internal Medicine | Admitting: Internal Medicine

## 2024-01-15 VITALS — BP 153/82 | HR 93 | Temp 97.6°F | Resp 16 | Wt 258.9 lb

## 2024-01-15 DIAGNOSIS — Z79899 Other long term (current) drug therapy: Secondary | ICD-10-CM | POA: Insufficient documentation

## 2024-01-15 DIAGNOSIS — Z87891 Personal history of nicotine dependence: Secondary | ICD-10-CM | POA: Diagnosis not present

## 2024-01-15 DIAGNOSIS — E611 Iron deficiency: Secondary | ICD-10-CM | POA: Diagnosis not present

## 2024-01-15 DIAGNOSIS — D751 Secondary polycythemia: Secondary | ICD-10-CM | POA: Diagnosis present

## 2024-01-15 DIAGNOSIS — Z8042 Family history of malignant neoplasm of prostate: Secondary | ICD-10-CM | POA: Insufficient documentation

## 2024-01-15 DIAGNOSIS — Z8049 Family history of malignant neoplasm of other genital organs: Secondary | ICD-10-CM | POA: Insufficient documentation

## 2024-01-15 DIAGNOSIS — Z809 Family history of malignant neoplasm, unspecified: Secondary | ICD-10-CM | POA: Insufficient documentation

## 2024-01-15 NOTE — Progress Notes (Signed)
 Patient has no concerns

## 2024-01-15 NOTE — Progress Notes (Signed)
 [facility] CONSULT NOTE  Patient Care Team: Dineen Rollene MATSU, FNP as PCP - General (Family Medicine) Rennie Cindy SAUNDERS, MD as Consulting Physician (Oncology)  CHIEF COMPLAINTS/PURPOSE OF CONSULTATION: ERYTHROCYTOSIS   HEMATOLOGY HISTORY  # ERYTHROCYTOSIS  [Hb; WBC; platelets]  HISTORY OF PRESENTING ILLNESS:  Lynn Hughes 63 y.o.  female pleasant patient was been referred to us  for further evaluation of erythrocytosis incidentally noted on routine blood work.  Discussed the use of AI scribe software for clinical note transcription with the patient, who gave verbal consent to proceed.  History of Present Illness   Lynn Hughes is a 63 year old woman  who presents for evaluation of elevated hematocrit and secondary polycythemia.  She was referred for hematology/oncology evaluation after routine laboratory monitoring revealed mildly elevated hematocrit values (46.6, 46.3, 49). She is asymptomatic with respect to hematologic complications, denying easy bruising, spontaneous bleeding, or thromboembolic events. She has no evidence of gastrointestinal.   She does have history of microscopic hematuria-status post workup with urology cystoscopy.    She continues to experience abnormal uterine bleeding due to intramural and subserosal fibroids and has not undergone surgical intervention. She previously underwent cystoscopy for microscopic hematuria, which was negative for significant pathology.  Regarding colon cancer screening she has not had colonoscopy, deferred Cologuard testing due to ongoing microscopic and visible blood loss.  She reports snoring but does not report morning fatigue or excessive daytime sleepiness. Her sleep is fragmented into two four-hour segments, and she feels rested upon waking. She has a remote history of light, social smoking, but no history of chronic pulmonary disease or prior sleep studies.  Patient is on diuretics.    Review of  Systems  Constitutional:  Negative for chills, diaphoresis, fever, malaise/fatigue and weight loss.  HENT:  Negative for nosebleeds and sore throat.   Eyes:  Negative for double vision.  Respiratory:  Negative for cough, hemoptysis, sputum production, shortness of breath and wheezing.   Cardiovascular:  Negative for chest pain, palpitations, orthopnea and leg swelling.  Gastrointestinal:  Negative for abdominal pain, blood in stool, constipation, diarrhea, heartburn, melena, nausea and vomiting.  Genitourinary:  Negative for dysuria, frequency and urgency.  Musculoskeletal:  Negative for back pain and joint pain.  Skin: Negative.  Negative for itching and rash.  Neurological:  Negative for dizziness, tingling, focal weakness, weakness and headaches.  Endo/Heme/Allergies:  Does not bruise/bleed easily.  Psychiatric/Behavioral:  Negative for depression. The patient is not nervous/anxious and does not have insomnia.      MEDICAL HISTORY:  Past Medical History:  Diagnosis Date   Allergy    Anemia    Anxiety    Arthritis    Diabetes mellitus without complication (HCC)    Fibroids    Heart murmur    Hypertension     SURGICAL HISTORY: Past Surgical History:  Procedure Laterality Date   BOWEL RESECTION N/A 08/22/2018   Procedure: SMALL BOWEL RESECTION;  Surgeon: Jordis Laneta FALCON, MD;  Location: [facility] ORS;  Service: General;  Laterality: N/A;   BREAST EXCISIONAL BIOPSY Left 1995   surgical exc calcs neg    VENTRAL HERNIA REPAIR N/A 08/22/2018   Procedure: HERNIA REPAIR VENTRAL ADULT;  Surgeon: Jordis Laneta FALCON, MD;  Location: [facility] ORS;  Service: General;  Laterality: N/A;    SOCIAL HISTORY: Social History   Socioeconomic History   Marital status: Single    Spouse name: Not on file   Number of children: Not on file   Years of  education: Not on file   Highest education level: Some college, no degree  Occupational History   Not on file  Tobacco Use   Smoking status: Former     Current packs/day: 0.00    Types: Cigarettes    Quit date: 09/04/1986    Years since quitting: 37.3   Smokeless tobacco: Never  Vaping Use   Vaping status: Never Used  Substance and Sexual Activity   Alcohol use: Not Currently    Comment: occassionally   Drug use: Never   Sexual activity: Not Currently    Birth control/protection: None  Other Topics Concern   Not on file  Social History Narrative   Not on file   Social Drivers of Health   Financial Resource Strain: Low Risk  (12/23/2023)   Overall Financial Resource Strain (CARDIA)    Difficulty of Paying Living Expenses: Not very hard  Food Insecurity: No Food Insecurity (01/15/2024)   Hunger Vital Sign    Worried About Running Out of Food in the Last Year: Never true    Ran Out of Food in the Last Year: Never true  Transportation Needs: No Transportation Needs (01/15/2024)   PRAPARE - Administrator, Civil Service (Medical): No    Lack of Transportation (Non-Medical): No  Physical Activity: Inactive (12/23/2023)   Exercise Vital Sign    Days of Exercise per Week: 0 days    Minutes of Exercise per Session: Not on file  Stress: No Stress Concern Present (12/23/2023)   [employer] of Occupational Health - Occupational Stress Questionnaire    Feeling of Stress: Not at all  Social Connections: Unknown (12/23/2023)   Social Connection and Isolation Panel    Frequency of Communication with Friends and Family: More than three times a week    Frequency of Social Gatherings with Friends and Family: Once a week    Attends Religious Services: Never    Database Administrator or Organizations: Not on file    Attends Banker Meetings: Not on file    Marital Status: Never married  Recent Concern: Social Connections - Socially Isolated (10/18/2023)   Social Connection and Isolation Panel    Frequency of Communication with Friends and Family: More than three times a week    Frequency of Social Gatherings  with Friends and Family: Three times a week    Attends Religious Services: Never    Active Member of Clubs or Organizations: No    Attends Banker Meetings: Not on file    Marital Status: Never married  Intimate Partner Violence: Not At Risk (01/15/2024)   Humiliation, Afraid, Rape, and Kick questionnaire    Fear of Current or Ex-Partner: No    Emotionally Abused: No    Physically Abused: No    Sexually Abused: No    FAMILY HISTORY: Family History  Problem Relation Age of Onset   Prostate cancer Father    Endometrial cancer Sister    Fibroids Sister    Cirrhosis Maternal Grandmother    Uterine cancer Maternal Aunt 40   Cancer Paternal Aunt        not sure   Heart attack Paternal Uncle        x2 uncles   Thyroid cancer Neg Hx     ALLERGIES:  is allergic to ace inhibitors, lisinopril, codeine, and oxycodone -acetaminophen .  MEDICATIONS:  Current Outpatient Medications  Medication Sig Dispense Refill   atorvastatin  (LIPITOR) 20 MG tablet Take 1 tablet (20 mg total)  by mouth daily. 90 tablet 3   ferrous sulfate 325 (65 FE) MG EC tablet Take 325 mg by mouth daily.     JARDIANCE  25 MG TABS tablet TAKE 1 TABLET BY MOUTH DAILY BEFORE BREAKFAST. 90 tablet 3   Lancets (ONETOUCH DELICA PLUS LANCET30G) MISC Check blood sugar am fasting and TID before meals. 100 each 3   losartan  (COZAAR ) 25 MG tablet Take 1 tablet (25 mg total) by mouth daily. 90 tablet 3   losartan  (COZAAR ) 50 MG tablet TAKE 1 TABLET BY MOUTH EVERY DAY 90 tablet 3   metoprolol  succinate (TOPROL -XL) 50 MG 24 hr tablet TAKE 1 TABLET BY MOUTH DAILY. TAKE WITH OR IMMEDIATELY FOLLOWING A MEAL. 90 tablet 3   ONETOUCH ULTRA test strip Check blood sugar fasting am and TID before meals. 100 each 3   triamterene -hydrochlorothiazide (MAXZIDE-25) 37.5-25 MG tablet TAKE 1 TABLET BY MOUTH EVERY DAY 90 tablet 3   No current facility-administered medications for this visit.     PHYSICAL EXAMINATION:   Vitals:    01/15/24 1402  BP: (!) 153/82  Pulse: 93  Resp: 16  Temp: 97.6 F (36.4 C)  SpO2: 92%   Filed Weights   01/15/24 1402  Weight: 258 lb 14.4 oz (117.4 kg)    Physical Exam Vitals and nursing note reviewed.  HENT:     Head: Normocephalic and atraumatic.     Mouth/Throat:     Pharynx: Oropharynx is clear.  Eyes:     Extraocular Movements: Extraocular movements intact.     Pupils: Pupils are equal, round, and reactive to light.  Cardiovascular:     Rate and Rhythm: Normal rate and regular rhythm.  Pulmonary:     Comments: Decreased breath sounds bilaterally.  Abdominal:     Palpations: Abdomen is soft.  Musculoskeletal:        General: Normal range of motion.     Cervical back: Normal range of motion.  Skin:    General: Skin is warm.  Neurological:     General: No focal deficit present.     Mental Status: She is alert and oriented to person, place, and time.  Psychiatric:        Behavior: Behavior normal.        Judgment: Judgment normal.      LABORATORY DATA:  I have reviewed the data as listed Lab Results  Component Value Date   WBC 7.3 12/18/2023   HGB 14.8 12/18/2023   HCT 49.1 (H) 12/18/2023   MCV 87 12/18/2023   PLT 358 12/18/2023   Recent Labs    04/26/23 0836 10/19/23 1159 12/18/23 0959  NA 138 143 141  K 3.6 4.3 4.1  CL 99 101 98  CO2 32 33* 27  GLUCOSE 134* 131* 137*  BUN 16 19 15   CREATININE 0.89 0.92 0.90  CALCIUM  9.9 10.0 10.0  PROT 7.8 7.5 7.1  ALBUMIN 4.3 4.2 4.1  AST 14 11 8   ALT 10 10 11   ALKPHOS 69 74 83  BILITOT 0.4 0.4 0.4     US  LT UPPER EXTREM LTD SOFT TISSUE NON VASCULAR Result Date: 12/31/2023 CLINICAL DATA:  Mass in the left upper arm. EXAM: ULTRASOUND LEFT UPPER EXTREMITY LIMITED TECHNIQUE: Ultrasound examination of the upper extremity soft tissues was performed in the area of clinical concern. COMPARISON:  None Available. FINDINGS: Scanning directed toward the region of concern demonstrates a hypoechoic lesion measuring  11.3 x 6.2 x 11.3 cm. The lesion is in the superficial subcutaneous  tissues and isoechoic to surrounding subcutaneous fat. 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 If the lesion enlarges or becomes painful, MRI with and without contrast is recommended for further evaluation. Electronically Signed   By: Debby Prader M.D.   On: 12/31/2023 10:57    ASSESSMENT & PLAN:   Erythrocytosis #Erythrocytosis- [..HCT 46-49].  Normal hemoglobin currently patient is asymptomatic. Elevated EPO; normal JAK-2-suggestive secondary erythrocytosis.  Suspect-OSA as a cause.  Since hematocrit is only slightly abnormal/and suspected secondary-patient is not at risk for stroke or blood clots.  Would not recommend any phlebotomy.  # Recommend further workup with a sleep study for suspected sleep apnea-which could is a potential cause of secondary erythrocytosis.  Defer to PCP for further workup.  # Iron deficiency noted-question history of fibroids -vaginal bleeding history of microscopic hematuria -currently on iron supplementation as per PCP.   Thank you Ms. Arnett FNP for allowing me to participate in the care of your pleasant patient. Please do not hesitate to contact me with questions or concerns in the interim.  # DISPOSITION: # NO labs- # follow up as needed-  Dr.B    Cindy JONELLE Joe, MD 01/15/2024 3:01 PM

## 2024-01-15 NOTE — Assessment & Plan Note (Addendum)
#  Erythrocytosis- [..HCT 46-49].  Normal hemoglobin currently patient is asymptomatic. Elevated EPO; normal JAK-2-suggestive secondary erythrocytosis.  Suspect-OSA as a cause.  Since hematocrit is only slightly abnormal/and suspected secondary-patient is not at risk for stroke or blood clots.  Would not recommend any phlebotomy.  # Recommend further workup with a sleep study for suspected sleep apnea-which could is a potential cause of secondary erythrocytosis.  Defer to PCP for further workup.  # Iron deficiency noted-question history of fibroids -vaginal bleeding history of microscopic hematuria -currently on iron supplementation as per PCP.   Thank you Ms. Arnett FNP for allowing me to participate in the care of your pleasant patient. Please do not hesitate to contact me with questions or concerns in the interim.  # DISPOSITION: # NO labs- # follow up as needed-  Dr.B

## 2024-01-16 NOTE — Progress Notes (Unsigned)
° °  Assessment & Plan:  There are no diagnoses linked to this encounter.   Return precautions given.   Risks, benefits, and alternatives of the medications and treatment plan prescribed today were discussed, and patient expressed understanding.   Education regarding symptom management and diagnosis given to patient on AVS either electronically or printed.  No follow-ups on file.  Rollene Northern, FNP  Subjective:    Patient ID: Lynn Hughes, female    DOB: [birth year], 63 y.o.   MRN: [id number]  CC: Lynn Hughes is a 63 y.o. female who presents today for follow up.   HPI: HPI  Allergies: Ace inhibitors, Lisinopril, Codeine, and Oxycodone -acetaminophen  Current Outpatient Medications on File Prior to Visit  Medication Sig Dispense Refill   atorvastatin  (LIPITOR) 20 MG tablet Take 1 tablet (20 mg total) by mouth daily. 90 tablet 3   ferrous sulfate 325 (65 FE) MG EC tablet Take 325 mg by mouth daily.     JARDIANCE  25 MG TABS tablet TAKE 1 TABLET BY MOUTH DAILY BEFORE BREAKFAST. 90 tablet 3   Lancets (ONETOUCH DELICA PLUS LANCET30G) MISC Check blood sugar am fasting and TID before meals. 100 each 3   losartan  (COZAAR ) 25 MG tablet Take 1 tablet (25 mg total) by mouth daily. 90 tablet 3   losartan  (COZAAR ) 50 MG tablet TAKE 1 TABLET BY MOUTH EVERY DAY 90 tablet 3   metoprolol  succinate (TOPROL -XL) 50 MG 24 hr tablet TAKE 1 TABLET BY MOUTH DAILY. TAKE WITH OR IMMEDIATELY FOLLOWING A MEAL. 90 tablet 3   ONETOUCH ULTRA test strip Check blood sugar fasting am and TID before meals. 100 each 3   triamterene -hydrochlorothiazide (MAXZIDE-25) 37.5-25 MG tablet TAKE 1 TABLET BY MOUTH EVERY DAY 90 tablet 3   No current facility-administered medications on file prior to visit.    Review of Systems    Objective:    There were no vitals taken for this visit. BP Readings from Last 3 Encounters:  01/15/24 (!) 153/82  10/19/23 (!) 162/70  04/26/23 132/82   Wt Readings from Last 3  Encounters:  01/15/24 258 lb 14.4 oz (117.4 kg)  12/24/23 265 lb (120.2 kg)  12/17/23 265 lb (120.2 kg)    Physical Exam

## 2024-01-17 ENCOUNTER — Telehealth: Payer: Self-pay | Admitting: Family

## 2024-01-17 ENCOUNTER — Other Ambulatory Visit

## 2024-01-17 DIAGNOSIS — R790 Abnormal level of blood mineral: Secondary | ICD-10-CM

## 2024-01-17 DIAGNOSIS — R899 Unspecified abnormal finding in specimens from other organs, systems and tissues: Secondary | ICD-10-CM

## 2024-01-17 DIAGNOSIS — E119 Type 2 diabetes mellitus without complications: Secondary | ICD-10-CM

## 2024-01-17 LAB — CBC WITH DIFFERENTIAL/PLATELET
Basophils Absolute: 0.1 K/uL (ref 0.0–0.1)
Basophils Relative: 0.8 % (ref 0.0–3.0)
Eosinophils Absolute: 0.2 K/uL (ref 0.0–0.7)
Eosinophils Relative: 3.8 % (ref 0.0–5.0)
HCT: 44.7 % (ref 36.0–46.0)
Hemoglobin: 14.5 g/dL (ref 12.0–15.0)
Lymphocytes Relative: 17.4 % (ref 12.0–46.0)
Lymphs Abs: 1.1 K/uL (ref 0.7–4.0)
MCHC: 32.5 g/dL (ref 30.0–36.0)
MCV: 83.3 fl (ref 78.0–100.0)
Monocytes Absolute: 0.4 K/uL (ref 0.1–1.0)
Monocytes Relative: 5.8 % (ref 3.0–12.0)
Neutro Abs: 4.6 K/uL (ref 1.4–7.7)
Neutrophils Relative %: 72.2 % (ref 43.0–77.0)
Platelets: 283 K/uL (ref 150.0–400.0)
RBC: 5.36 Mil/uL — ABNORMAL HIGH (ref 3.87–5.11)
RDW: 19.4 % — ABNORMAL HIGH (ref 11.5–15.5)
WBC: 6.4 K/uL (ref 4.0–10.5)

## 2024-01-17 LAB — COMPREHENSIVE METABOLIC PANEL WITH GFR
ALT: 9 U/L (ref 0–35)
AST: 11 U/L (ref 0–37)
Albumin: 4.1 g/dL (ref 3.5–5.2)
Alkaline Phosphatase: 65 U/L (ref 39–117)
BUN: 20 mg/dL (ref 6–23)
CO2: 31 meq/L (ref 19–32)
Calcium: 9.6 mg/dL (ref 8.4–10.5)
Chloride: 102 meq/L (ref 96–112)
Creatinine, Ser: 0.86 mg/dL (ref 0.40–1.20)
GFR: 70.97 mL/min (ref >60.00)
Glucose, Bld: 137 mg/dL — ABNORMAL HIGH (ref 70–99)
Potassium: 3.6 meq/L (ref 3.5–5.1)
Sodium: 141 meq/L (ref 135–145)
Total Bilirubin: 0.4 mg/dL (ref 0.2–1.2)
Total Protein: 7.1 g/dL (ref 6.0–8.3)

## 2024-01-17 LAB — BASIC METABOLIC PANEL WITH GFR
BUN: 20 mg/dL (ref 6–23)
CO2: 31 meq/L (ref 19–32)
Calcium: 9.6 mg/dL (ref 8.4–10.5)
Chloride: 102 meq/L (ref 96–112)
Creatinine, Ser: 0.86 mg/dL (ref 0.40–1.20)
GFR: 70.97 mL/min (ref >60.00)
Glucose, Bld: 137 mg/dL — ABNORMAL HIGH (ref 70–99)
Potassium: 3.6 meq/L (ref 3.5–5.1)
Sodium: 141 meq/L (ref 135–145)

## 2024-01-17 LAB — B12 AND FOLATE PANEL
Folate: 8.3 ng/mL (ref >5.9)
Vitamin B-12: 166 pg/mL — ABNORMAL LOW (ref 211–911)

## 2024-01-17 LAB — MICROALBUMIN / CREATININE URINE RATIO
Creatinine,U: 72.2 mg/dL
Microalb Creat Ratio: 147.3 mg/g — ABNORMAL HIGH (ref 0.0–30.0)
Microalb, Ur: 10.6 mg/dL — ABNORMAL HIGH (ref 0.0–1.9)

## 2024-01-17 LAB — HEMOGLOBIN A1C: Hgb A1c MFr Bld: 7.2 % — ABNORMAL HIGH (ref 4.6–6.5)

## 2024-01-17 NOTE — Addendum Note (Signed)
 Addended by: TANDA HARVEY D on: 01/17/2024 08:51 AM   Modules accepted: Orders

## 2024-01-17 NOTE — Telephone Encounter (Signed)
 Lm and sent MyChart message:  Please call our office at [phone] to schedule your Welcome to Medicare appointment with your provider. The appointment date must be prior to 10/07/2024.  E2C2 please schedule with provider

## 2024-01-18 ENCOUNTER — Other Ambulatory Visit

## 2024-01-18 LAB — JAK2 V617F RFX CALR/MPL/E12-15

## 2024-01-22 ENCOUNTER — Ambulatory Visit: Admitting: Family

## 2024-01-22 ENCOUNTER — Encounter: Payer: Self-pay | Admitting: Family

## 2024-01-22 VITALS — BP 138/78 | HR 87 | Temp 98.4°F | Ht 59.0 in | Wt 259.8 lb

## 2024-01-22 DIAGNOSIS — D751 Secondary polycythemia: Secondary | ICD-10-CM

## 2024-01-22 DIAGNOSIS — E538 Deficiency of other specified B group vitamins: Secondary | ICD-10-CM | POA: Insufficient documentation

## 2024-01-22 DIAGNOSIS — N939 Abnormal uterine and vaginal bleeding, unspecified: Secondary | ICD-10-CM

## 2024-01-22 DIAGNOSIS — I152 Hypertension secondary to endocrine disorders: Secondary | ICD-10-CM

## 2024-01-22 DIAGNOSIS — Z7984 Long term (current) use of oral hypoglycemic drugs: Secondary | ICD-10-CM

## 2024-01-22 DIAGNOSIS — E1159 Type 2 diabetes mellitus with other circulatory complications: Secondary | ICD-10-CM | POA: Diagnosis not present

## 2024-01-22 DIAGNOSIS — E119 Type 2 diabetes mellitus without complications: Secondary | ICD-10-CM

## 2024-01-22 MED ORDER — LOSARTAN POTASSIUM 100 MG PO TABS: 100.0000 mg | ORAL_TABLET | Freq: Every day | ORAL | 3 refills | Status: AC

## 2024-01-22 NOTE — Assessment & Plan Note (Signed)
 Reviewed hematology consult.  Referral to pulmonology for evaluation of OSA

## 2024-01-22 NOTE — Assessment & Plan Note (Addendum)
 Advised to start B12 1000 mcg daily.  Pending trends intrinsic factor, anti parietal abs labs

## 2024-01-22 NOTE — Assessment & Plan Note (Signed)
 Reiterated mandatory follow-up with GYN for the continued surveillance of vaginal bleeding.  Provided phone number for her today to call [facility].  For now, advised to continue ferrous sulfate 325 mg once daily.  Once bleeding is controlled, advised she can stop ferrous sulfate.

## 2024-01-22 NOTE — Patient Instructions (Addendum)
 B12 is low which can cause neuropathy ( numbness, tingling), memory changes, and fatigue.  Optimal level of B12 is greater than 400.    I have ordered more labs to evaluate for this.  We can obtain at your appointment with me this week   I would also like you to start over the counter b12 1000mcg pill daily.  You may find this over-the-counter. Sublingual or dissolving tablets work very well as well. You may find 1000 mcg sublingual as well.   Please call to schedule follow up GYN    [facility] at [facility] [address]  [place],  [place]  [address]  Get Driving Directions Main: [phone]   Referral for annual eye exam and to pulmonology for sleep study evaluation  INcrease losartan  to 100mg . Ensure you schedule labs ONE week after increase of medication to monitor potassium.

## 2024-01-22 NOTE — Assessment & Plan Note (Addendum)
 Chronic, uncontrolled.  Patient is compliant with Jardiance  25 mg daily.  Metformin intolerance.  Interested in GLP agonist.  Advised Lynn Hughes would need baseline eye exam to start medication.  Counseled on low glycemic diet and avoidance of sweet tea.  Increase of losartan  to 100 mg daily in the setting of proteinuria, blood pressure greater than 130/80.  Lynn Hughes understands repeat BMP lab is imperative 7 days after increase of medication.  Close follow-up

## 2024-02-04 LAB — IRON,TIBC AND FERRITIN PANEL
Ferritin: 36 ng/mL (ref 15–150)
Iron Saturation: 15 % (ref 15–55)
Iron: 53 ug/dL (ref 27–139)
Total Iron Binding Capacity: 358 ug/dL (ref 250–450)
UIBC: 305 ug/dL (ref 118–369)

## 2024-02-04 LAB — CALR +MPL + E12-E15  (REFLEX)

## 2024-02-04 LAB — JAK2 V617F RFX CALR/MPL/E12-15

## 2024-02-18 ENCOUNTER — Ambulatory Visit: Admitting: Dermatology

## 2024-02-20 ENCOUNTER — Telehealth: Payer: Self-pay

## 2024-02-20 ENCOUNTER — Encounter: Payer: Self-pay | Admitting: Family

## 2024-02-20 NOTE — Telephone Encounter (Signed)
 I also mailed a letter to patient.

## 2024-02-20 NOTE — Telephone Encounter (Signed)
 I called patient to reschedule her Welcome to Medicare appointment with Rollene Northern, FNP, on 02/28/2024, as her appointment was scheduled for 30 minutes and this type of appointment should be 60 minutes.  Patient's voice mailbox is full.  I sent a message to patient via MyChart.  E2C2 - when patient calls back, please assist her with rescheduling her Welcome to Medicare appointment with Rollene Northern, FNP.  This appointment must take place within the first twelve months after patient joins Medicare.  This appointment must be 60 minutes in length.

## 2024-02-22 ENCOUNTER — Other Ambulatory Visit: Payer: Self-pay | Admitting: Family

## 2024-02-22 DIAGNOSIS — E119 Type 2 diabetes mellitus without complications: Secondary | ICD-10-CM

## 2024-02-28 ENCOUNTER — Ambulatory Visit: Admitting: Dermatology

## 2024-02-28 ENCOUNTER — Encounter: Admitting: Family

## 2024-02-28 DIAGNOSIS — L821 Other seborrheic keratosis: Secondary | ICD-10-CM | POA: Diagnosis not present

## 2024-02-28 NOTE — Progress Notes (Signed)
" ° °  Follow-Up Visit   Subjective  Lynn Hughes is a 64 y.o. female who presents for the following: Sk's/DPN ~50 lesions were treated on each side L and R face.  100 total lesions treated with ED 12/03/2023; pt says spots are much better  The following portions of the chart were reviewed this encounter and updated as appropriate: medications, allergies, medical history  Review of Systems:  No other skin or systemic complaints except as noted in HPI or Assessment and Plan.  Objective  Well appearing patient in no apparent distress; mood and affect are within normal limits.  A focused examination was performed of the following areas: Face  Relevant exam findings are noted in the Assessment and Plan.    Assessment & Plan    SEBORRHEIC KERATOSIS/DPN - scattered flat stuck-on, waxy, tan-brown papules on the face, much improved when compared to baseline photo - patient with good result from treatment (ED), and she is satisfied - Benign-appearing - Discussed benign etiology and prognosis. - Observe, can repeat treatment (ED) for any that regrow. - Call for any changes  May continue moisturizer with exfoliant (Urea, Salicylic acid, or Lactic acid) one to two times daily to help smooth rough and bumpy skin. OTC options include Cetaphil Rough and Bumpy lotion (Urea), Eucerin Roughness Relief lotion or spot treatment cream (Urea), CeraVe SA lotion/cream for Rough and Bumpy skin (Sal Acid), Gold Bond Rough and Bumpy cream (Sal Acid), and AmLactin 12% lotion/cream (Lactic Acid). If applying in morning, also apply sunscreen to sun-exposed areas, since these exfoliating moisturizers can increase sensitivity to sun.       Return if symptoms worsen or fail to improve.  I, Emerick Ege, CMA am acting as scribe for Rexene Rattler, MD.   Documentation: I have reviewed the above documentation for accuracy and completeness, and I agree with the above.  Rexene Rattler, MD    "

## 2024-02-28 NOTE — Patient Instructions (Signed)

## 2024-03-05 ENCOUNTER — Other Ambulatory Visit

## 2024-03-05 DIAGNOSIS — E1159 Type 2 diabetes mellitus with other circulatory complications: Secondary | ICD-10-CM

## 2024-03-05 DIAGNOSIS — E538 Deficiency of other specified B group vitamins: Secondary | ICD-10-CM

## 2024-03-05 DIAGNOSIS — I152 Hypertension secondary to endocrine disorders: Secondary | ICD-10-CM

## 2024-03-05 LAB — BASIC METABOLIC PANEL WITH GFR
BUN: 19 mg/dL (ref 6–23)
CO2: 33 meq/L — ABNORMAL HIGH (ref 19–32)
Calcium: 10.1 mg/dL (ref 8.4–10.5)
Chloride: 97 meq/L (ref 96–112)
Creatinine, Ser: 0.86 mg/dL (ref 0.40–1.20)
GFR: 70.91 mL/min
Glucose, Bld: 145 mg/dL — ABNORMAL HIGH (ref 70–99)
Potassium: 3.8 meq/L (ref 3.5–5.1)
Sodium: 138 meq/L (ref 135–145)

## 2024-03-05 NOTE — Addendum Note (Signed)
 Addended by: EMERY PRESIDENT D on: 03/05/2024 10:05 AM   Modules accepted: Orders

## 2024-03-06 LAB — INTRINSIC FACTOR ANTIBODIES: Intrinsic Factor Abs, Serum: 1 [AU]/ml (ref 0.0–1.1)

## 2024-03-07 LAB — CELIAC DISEASE AB SCREEN W/RFX
Deamidated Gliadin Abs, IgA: 5 U (ref 0–19)
Immunoglobulin A, (IgA) QN, Serum: 177 mg/dL (ref 87–352)
t-Transglutaminase (tTG) IgA: <2 U/mL (ref 0–3)

## 2024-03-11 LAB — ANTI-PARIETAL ANTIBODY: PARIETAL CELL AB SCREEN: NEGATIVE

## 2024-03-11 LAB — METHYLMALONIC ACID, SERUM: Methylmalonic Acid, Quant: 156 nmol/L (ref 69–390)

## 2024-03-11 LAB — HOMOCYSTEINE: Homocysteine: 15.3 umol/L — ABNORMAL HIGH

## 2024-03-17 ENCOUNTER — Ambulatory Visit: Admitting: Sleep Medicine

## 2024-04-24 ENCOUNTER — Encounter: Admitting: Family
